# Patient Record
Sex: Male | Born: 1970 | Race: Black or African American | Hispanic: No | Marital: Single | Smoking: Never smoker
Health system: Southern US, Community
[De-identification: ages and names within clinical notes are randomized; demographics above are authoritative.]

## PROBLEM LIST (undated history)

## (undated) DIAGNOSIS — K644 Residual hemorrhoidal skin tags: Secondary | ICD-10-CM

## (undated) DIAGNOSIS — Z8619 Personal history of other infectious and parasitic diseases: Secondary | ICD-10-CM

## (undated) DIAGNOSIS — C61 Malignant neoplasm of prostate: Secondary | ICD-10-CM

## (undated) DIAGNOSIS — K921 Melena: Secondary | ICD-10-CM

## (undated) DIAGNOSIS — T7840XA Allergy, unspecified, initial encounter: Secondary | ICD-10-CM

## (undated) HISTORY — DX: Residual hemorrhoidal skin tags: K64.4

## (undated) HISTORY — DX: Personal history of other infectious and parasitic diseases: Z86.19

## (undated) HISTORY — DX: Allergy, unspecified, initial encounter: T78.40XA

## (undated) HISTORY — PX: COLONOSCOPY: SHX174

## (undated) HISTORY — DX: Melena: K92.1

## (undated) HISTORY — DX: Malignant neoplasm of prostate: C61

## (undated) HISTORY — PX: NO PAST SURGERIES: SHX2092

---

## 1998-01-24 ENCOUNTER — Emergency Department (HOSPITAL_COMMUNITY): Admission: EM | Admit: 1998-01-24 | Discharge: 1998-01-24 | Payer: Self-pay | Admitting: Emergency Medicine

## 1998-01-24 ENCOUNTER — Encounter: Payer: Self-pay | Admitting: Emergency Medicine

## 2001-01-16 ENCOUNTER — Emergency Department (HOSPITAL_COMMUNITY): Admission: EM | Admit: 2001-01-16 | Discharge: 2001-01-16 | Payer: Self-pay | Admitting: Emergency Medicine

## 2006-11-16 ENCOUNTER — Ambulatory Visit: Payer: Self-pay | Admitting: Family Medicine

## 2006-12-27 ENCOUNTER — Encounter: Admission: RE | Admit: 2006-12-27 | Discharge: 2006-12-27 | Payer: Self-pay | Admitting: Emergency Medicine

## 2007-10-13 IMAGING — US US ABDOMEN COMPLETE
1 series · 14 of 25 positions shown · non-contrast
Comparison: none

CLINICAL DATA: Epigastric abdominal pain. 
 ABDOMEN ULTRASOUND:
TECHNIQUE: Complete abdominal ultrasound examination was performed including evaluation of the liver, gallbladder, bile ducts, pancreas, kidneys, spleen, IVC, and abdominal aorta.

[Series 1: unknown · 0.28mm/px · 14 of 53 slices shown]
[im 1/53]
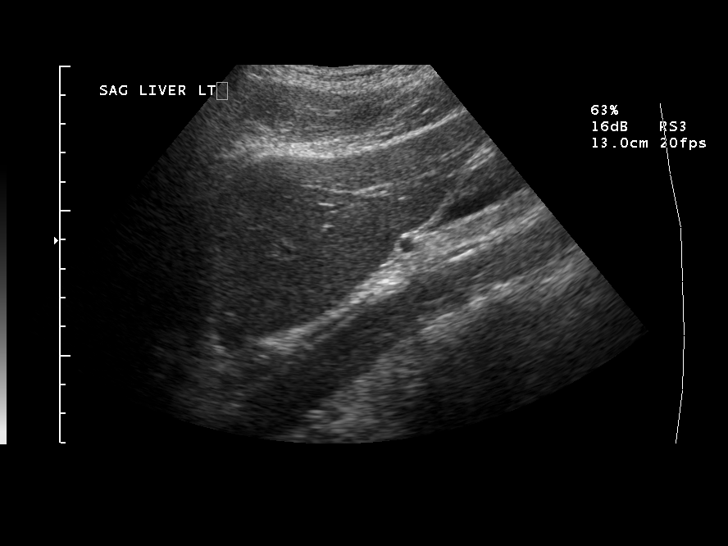
[im 5/53]
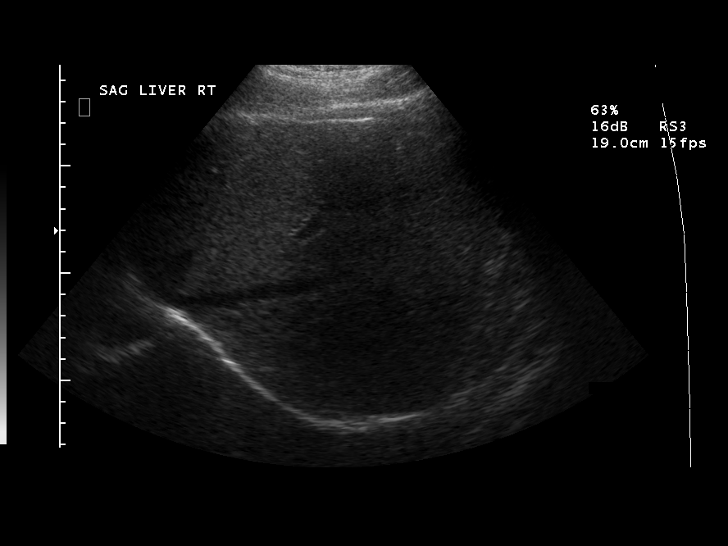
[im 9/53]
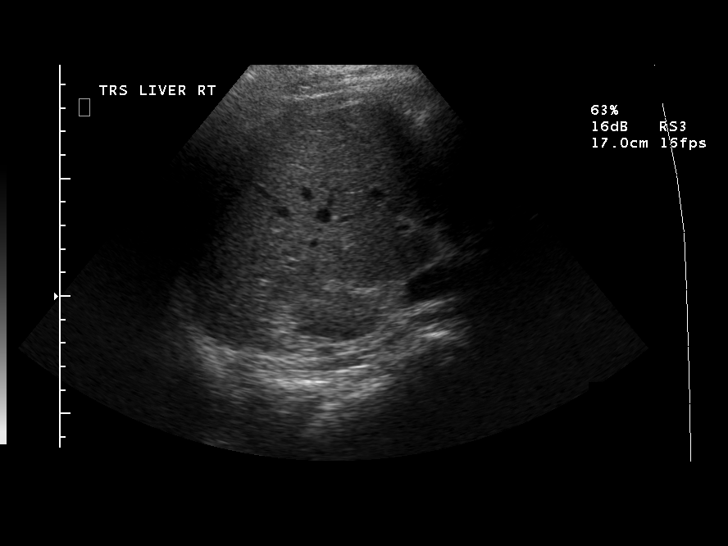
[im 14/53]
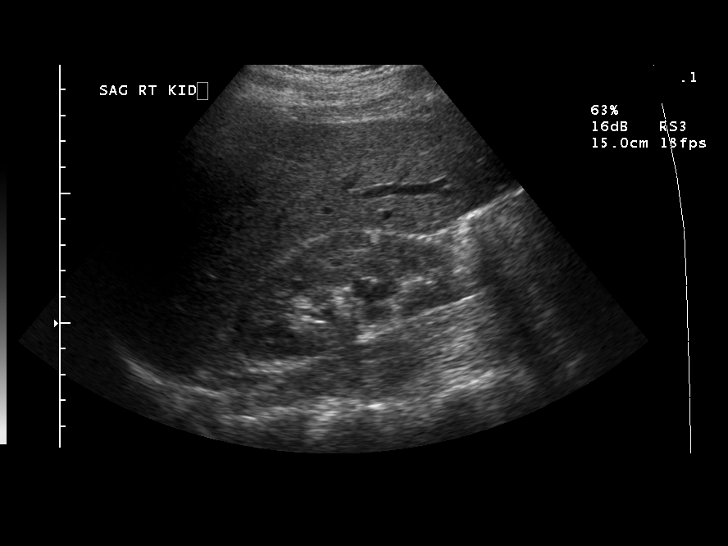
[im 18/53]
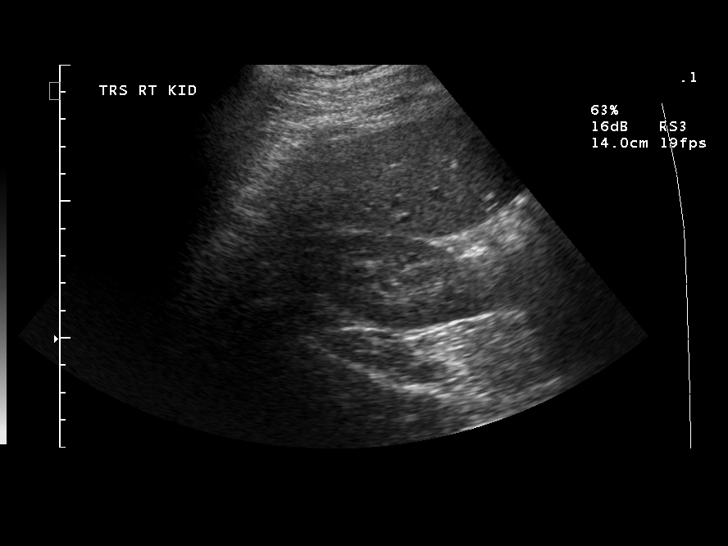
[im 20/53]
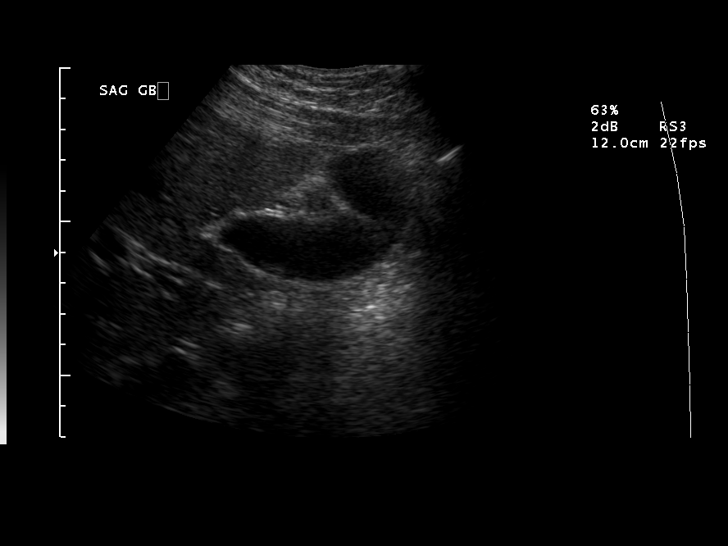
[im 24/53]
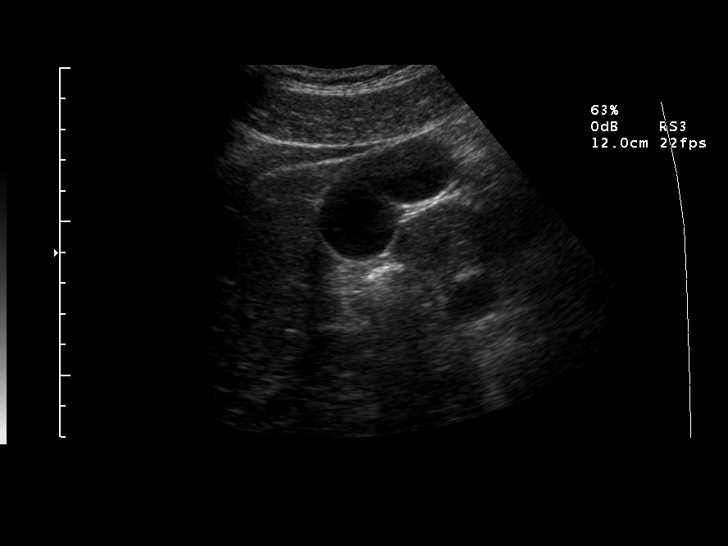
[im 29/53]
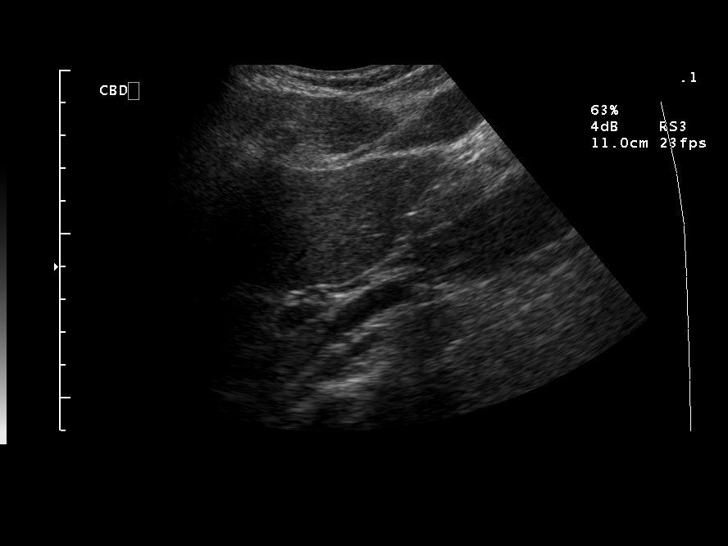
[im 33/53]
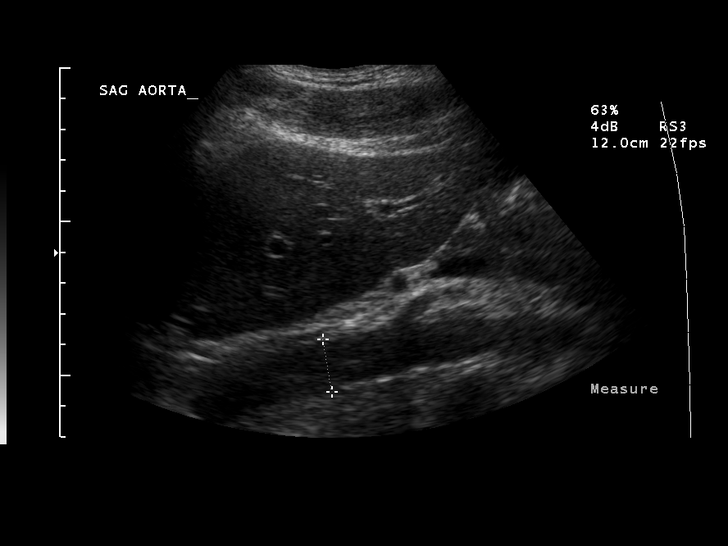
[im 35/53]
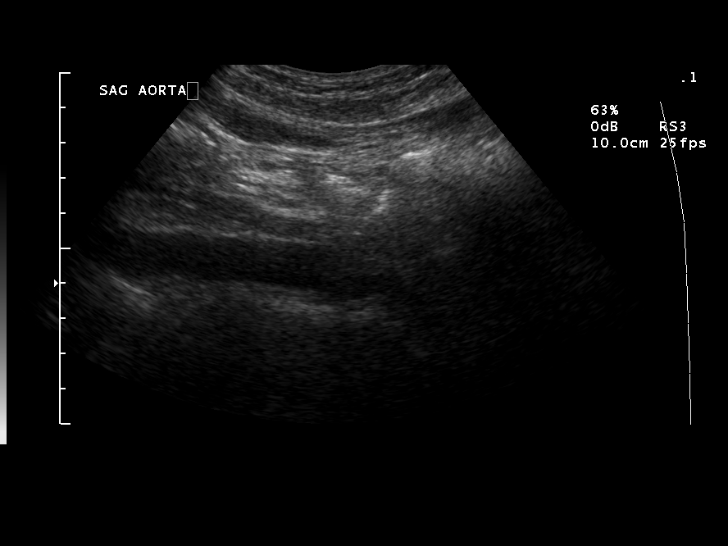
[im 40/53]
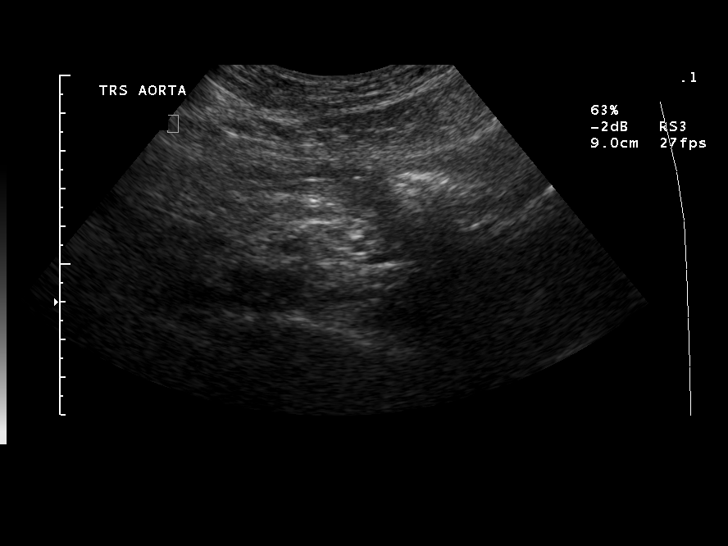
[im 44/53]
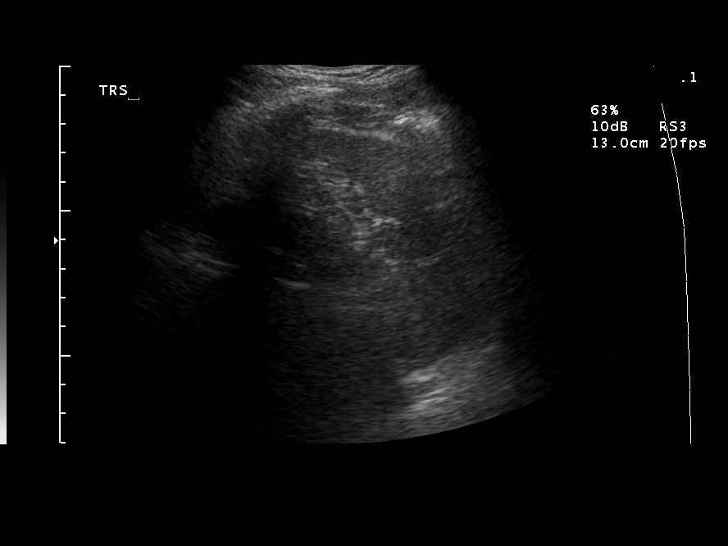
[im 48/53]
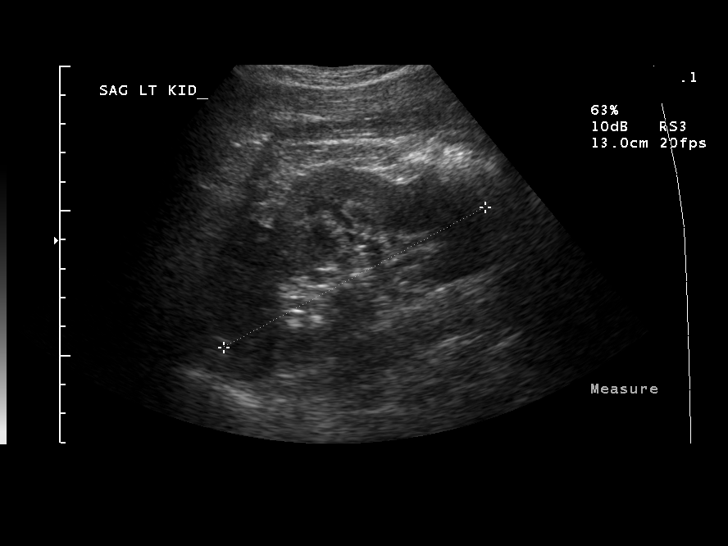
[im 53/53]
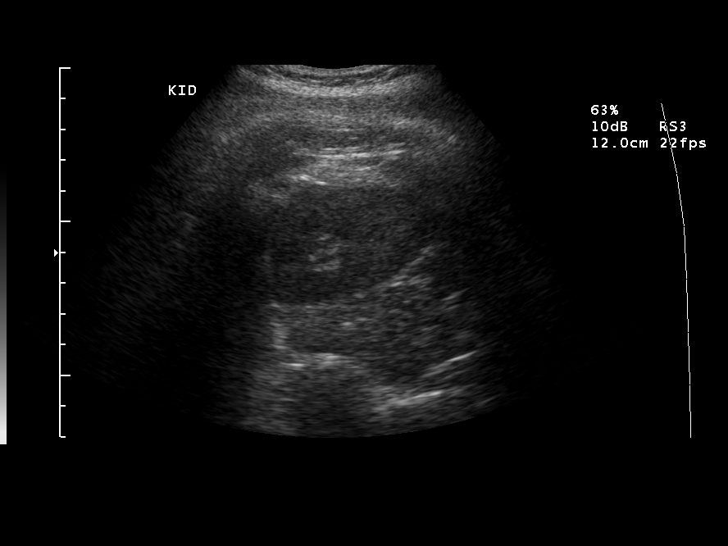

[14 of 25 positions shown; findings below may reference images not displayed]

FINDINGS: There is no evidence of gallstones or biliary ductal dilatation.  The liver is within normal limits in echogenicity, and no focal liver lesions are seen.  The visualized portions of the IVC and pancreas are unremarkable.
 There is no evidence of splenomegaly.  The kidneys are unremarkable, and there is no evidence of hydronephrosis.  The abdominal aorta is non-dilated.
IMPRESSION: Negative abdominal ultrasound.

## 2011-04-12 ENCOUNTER — Ambulatory Visit: Payer: Self-pay | Admitting: Family Medicine

## 2011-04-14 ENCOUNTER — Ambulatory Visit (INDEPENDENT_AMBULATORY_CARE_PROVIDER_SITE_OTHER): Payer: BC Managed Care – PPO | Admitting: Family Medicine

## 2011-04-14 ENCOUNTER — Encounter: Payer: Self-pay | Admitting: Family Medicine

## 2011-04-14 DIAGNOSIS — Z13220 Encounter for screening for lipoid disorders: Secondary | ICD-10-CM

## 2011-04-14 DIAGNOSIS — Z833 Family history of diabetes mellitus: Secondary | ICD-10-CM

## 2011-04-14 DIAGNOSIS — Z125 Encounter for screening for malignant neoplasm of prostate: Secondary | ICD-10-CM

## 2011-04-14 DIAGNOSIS — Z23 Encounter for immunization: Secondary | ICD-10-CM

## 2011-04-14 DIAGNOSIS — K649 Unspecified hemorrhoids: Secondary | ICD-10-CM

## 2011-04-14 NOTE — Patient Instructions (Addendum)
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  On the way out- ask about getting a lab visit set up (along with a flu shot then, too). You can get your results through our phone system.  Follow the instructions on the blue card. Let me know if you have other concerns.  If you have more bleeding, let me know

## 2011-04-15 DIAGNOSIS — Z125 Encounter for screening for malignant neoplasm of prostate: Secondary | ICD-10-CM | POA: Insufficient documentation

## 2011-04-15 DIAGNOSIS — Z23 Encounter for immunization: Secondary | ICD-10-CM | POA: Insufficient documentation

## 2011-04-15 DIAGNOSIS — Z833 Family history of diabetes mellitus: Secondary | ICD-10-CM | POA: Insufficient documentation

## 2011-04-15 DIAGNOSIS — K649 Unspecified hemorrhoids: Secondary | ICD-10-CM | POA: Insufficient documentation

## 2011-04-15 NOTE — Assessment & Plan Note (Signed)
Return for fasting labs.  Continue exercise .

## 2011-04-15 NOTE — Assessment & Plan Note (Signed)
Done today.

## 2011-04-15 NOTE — Progress Notes (Signed)
Ext hemorrhoid.  Blood in stool with painful BM. BRBPR.  Episodic.  No known FH colon CA.  No other bleeding, bruising.  Stools softer with metamucil use.  No abd pain.  No vomiting or other GI sx.    Due for Tdap.  FH DM, due for labs.  No known personal h/o DM2.    Prostate cancer screening.  No known FH, but little known about father's history.  No dysuria.  We talked about options.    PMH and SH reviewed  ROS: See HPI, otherwise noncontributory.  Meds, vitals, and allergies reviewed.   GEN: nad, alert and oriented HEENT: mucous membranes moist NECK: supple w/o LA CV: rrr.  no murmur PULM: ctab, no inc wob ABD: soft, +bs EXT: no edema SKIN: no acute rash Prostate gland firm and smooth, no enlargement, nodularity, tenderness, mass, asymmetry or induration. Ext hemorrhoids noted, old and nonthrombosed

## 2011-04-15 NOTE — Assessment & Plan Note (Signed)
Will get this when he returns for labs .

## 2011-04-15 NOTE — Assessment & Plan Note (Signed)
Anatomy d/w pt.  Likely cause of bleeding.  If he continues to have bleeding in spite of soft stools (recently started metamucil) then we can refer.  No ominous findings and no FH colon CA.

## 2011-04-15 NOTE — Assessment & Plan Note (Signed)
Normal DRE and will check PSA for baseline given the lack of available FH.  He agrees.

## 2011-04-19 ENCOUNTER — Other Ambulatory Visit (INDEPENDENT_AMBULATORY_CARE_PROVIDER_SITE_OTHER): Payer: BC Managed Care – PPO

## 2011-04-19 DIAGNOSIS — Z833 Family history of diabetes mellitus: Secondary | ICD-10-CM

## 2011-04-19 DIAGNOSIS — Z125 Encounter for screening for malignant neoplasm of prostate: Secondary | ICD-10-CM

## 2011-04-19 DIAGNOSIS — Z13220 Encounter for screening for lipoid disorders: Secondary | ICD-10-CM

## 2011-04-19 LAB — LIPID PANEL
Cholesterol: 211 mg/dL — ABNORMAL HIGH (ref 0–200)
Total CHOL/HDL Ratio: 5
VLDL: 32.6 mg/dL (ref 0.0–40.0)

## 2011-04-19 LAB — PSA: PSA: 1.28 ng/mL (ref 0.10–4.00)

## 2011-10-04 ENCOUNTER — Ambulatory Visit (INDEPENDENT_AMBULATORY_CARE_PROVIDER_SITE_OTHER): Payer: BC Managed Care – PPO | Admitting: Family Medicine

## 2011-10-04 ENCOUNTER — Encounter: Payer: Self-pay | Admitting: Family Medicine

## 2011-10-04 VITALS — BP 124/70 | HR 78 | Temp 98.5°F | Wt 227.0 lb

## 2011-10-04 DIAGNOSIS — M25519 Pain in unspecified shoulder: Secondary | ICD-10-CM

## 2011-10-04 MED ORDER — TRAMADOL HCL 50 MG PO TABS: 50.0000 mg | ORAL_TABLET | Freq: Three times a day (TID) | ORAL | Status: DC | PRN

## 2011-10-04 MED ORDER — IBUPROFEN 200 MG PO TABS: 600.0000 mg | ORAL_TABLET | Freq: Three times a day (TID) | ORAL | Status: AC | PRN

## 2011-10-04 NOTE — Assessment & Plan Note (Signed)
Likely cuff defect and needs ortho eval.  He is some better today in terms of pain.  D/w pt about nsaids with GI caution, sedation caution on tramadol and ROM exercise to prev frozen shoulder.  Anatomy d/w pt.  He understood.  App ortho help.

## 2011-10-04 NOTE — Progress Notes (Signed)
Saturday his L shoulder started getting stiff.  More painful Sunday.  Use a stick-on icy hot pad with some relief.  Had been exercising regularly.  Was fine lifting at the end of last week before the pain started.  Was lifting his baseline level of weight.  Was benching 245.  Shoulder shrugs 100lbs on each hand.  Curling 115 on a bar (57.5 per arm).  R shoulder is fine. Feeling well o/w.    Can't wash his hair or reach for something in his back pocket with his L hand.    Meds, vitals, and allergies reviewed.   ROS: See HPI.  Otherwise, noncontributory.  nad Normal ROM at the neck R shoulder with normal ROM L shoulder with normal inspection Pain with ext rotation>int rotation.  + scap assist, improves ext rotation. No arm drop but sig pain with abduction >90 deg Speed's neg, + impingement.  Distally nv intact

## 2011-10-04 NOTE — Patient Instructions (Signed)
Take ibuprofen and tramadol for pain.  No lifting in the meantime.  Use the range of motion exercises we talked about.  See Shirlee Limerick about your referral before you leave today.

## 2012-02-01 ENCOUNTER — Encounter: Payer: Self-pay | Admitting: Family Medicine

## 2012-02-01 ENCOUNTER — Ambulatory Visit (INDEPENDENT_AMBULATORY_CARE_PROVIDER_SITE_OTHER): Payer: BC Managed Care – PPO | Admitting: Family Medicine

## 2012-02-01 VITALS — BP 158/102 | HR 68 | Temp 98.5°F | Wt 222.5 lb

## 2012-02-01 DIAGNOSIS — M549 Dorsalgia, unspecified: Secondary | ICD-10-CM | POA: Insufficient documentation

## 2012-02-01 MED ORDER — CYCLOBENZAPRINE HCL 10 MG PO TABS: 5.0000 mg | ORAL_TABLET | Freq: Three times a day (TID) | ORAL | Status: DC | PRN

## 2012-02-01 MED ORDER — IBUPROFEN 600 MG PO TABS: 600.0000 mg | ORAL_TABLET | Freq: Three times a day (TID) | ORAL | Status: DC | PRN

## 2012-02-01 NOTE — Patient Instructions (Signed)
Use the stretches for your back.  Take 600mg  of ibuprofen with food up to 3 times a day.  Take 5-10mg  of flexeril up to 3 times a day (it can make you drowsy).  This should get better.  Take care.   Check your BP when you aren't hurting and notify me if consistently >140/>90.

## 2012-02-01 NOTE — Progress Notes (Signed)
Back pain.  Going on for a few weeks.  Pain in R>L lower side.  It 'catches' when he tries to get up from sitting.  Gets tight is not mobile.  Some better standing.  No trauma, no trigger known.  Has been exercising, at his baseline. No FCNAVD.  No weakness in legs.  Occ soreness in thighs.  He can get relief laying down at night, but can have pain rolling over.    Meds, vitals, and allergies reviewed.   ROS: See HPI.  Otherwise, noncontributory.  nad rrr ctab Back w/o midline pain No rash, back isn't ttp in paraspinal muscles superficially but he has pain on R SI testing and facet loading SLR neg Distally nv intact with normal strength, sensation, DTRs in BLE.

## 2012-02-01 NOTE — Assessment & Plan Note (Signed)
Likely facet and SI irritation with deep muscle spasm.  No need to image today.  Would use stretching, handout given.  GI caution on ibuprofen and sedation caution on flexeril. . Should improve.  F/u prn.  Anatomy d/w pt.  He agrees.

## 2012-02-10 ENCOUNTER — Telehealth: Payer: Self-pay

## 2012-02-10 NOTE — Telephone Encounter (Signed)
Pt left v/m BP was 150/? Could not understand diastolic reading. Called numerous times. Home # memory is full. Work # not accepting calls and cell # not working #. Will try again.

## 2012-02-14 NOTE — Telephone Encounter (Signed)
Tried again to contact pt by phone; same results as 02/10/12. Mailed letter to pt requesting call back with BP reading.

## 2012-06-28 ENCOUNTER — Ambulatory Visit (INDEPENDENT_AMBULATORY_CARE_PROVIDER_SITE_OTHER): Payer: BC Managed Care – PPO | Admitting: Family Medicine

## 2012-06-28 ENCOUNTER — Encounter: Payer: Self-pay | Admitting: Family Medicine

## 2012-06-28 VITALS — BP 128/86 | HR 72 | Temp 98.1°F | Wt 217.8 lb

## 2012-06-28 DIAGNOSIS — R030 Elevated blood-pressure reading, without diagnosis of hypertension: Secondary | ICD-10-CM

## 2012-06-28 DIAGNOSIS — N529 Male erectile dysfunction, unspecified: Secondary | ICD-10-CM

## 2012-06-28 MED ORDER — SILDENAFIL CITRATE 100 MG PO TABS: 50.0000 mg | ORAL_TABLET | Freq: Every day | ORAL | Status: DC | PRN

## 2012-06-28 NOTE — Assessment & Plan Note (Signed)
Likely with a stress component.  Would use viagra for now and I would expect this to resolve.

## 2012-06-28 NOTE — Assessment & Plan Note (Signed)
Normalized today.  Likely related to diet/exercise changes and social upheaval.  Reassured.  No intervention other than diet/exercise.

## 2012-06-28 NOTE — Progress Notes (Signed)
His shoulder is better. Saw ortho.  Didn't need injection.   Here to f/u on BP readings.  His BP was up (~140/88) on a check for pre employment eval. He was advised to have it rechecked.   He isn't taking NSAIDs.    Holidays (after the death of his mother) were tough for him. He is doing better recently.  He's been more active recently.  No SI/HI.  He's starting back to exercising.  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   He noted ED during prev periods of high stress, years ago.  He improved on viagra and did well for years.  ED returned during recent period of high stress described above.   Meds, vitals, and allergies reviewed.   ROS: See HPI.  Otherwise, noncontributory.  GEN: nad, alert and oriented HEENT: mucous membranes moist NECK: supple w/o LA CV: rrr.  no murmur PULM: ctab, no inc wob ABD: soft, +bs EXT: no edema SKIN: no acute rash  Recheck BP 122/82

## 2012-06-28 NOTE — Patient Instructions (Addendum)
If you aren't improving with the medicine, then let me know. Your BP was fine.  Take care.

## 2012-07-11 ENCOUNTER — Other Ambulatory Visit: Payer: Self-pay | Admitting: Family Medicine

## 2012-07-16 ENCOUNTER — Other Ambulatory Visit: Payer: BC Managed Care – PPO

## 2012-07-23 ENCOUNTER — Encounter: Payer: BC Managed Care – PPO | Admitting: Family Medicine

## 2012-09-11 ENCOUNTER — Other Ambulatory Visit (INDEPENDENT_AMBULATORY_CARE_PROVIDER_SITE_OTHER): Payer: BC Managed Care – PPO

## 2012-09-11 DIAGNOSIS — E7800 Pure hypercholesterolemia, unspecified: Secondary | ICD-10-CM

## 2012-09-11 LAB — BASIC METABOLIC PANEL
BUN: 16 mg/dL (ref 6–23)
CO2: 28 mEq/L (ref 19–32)
Chloride: 103 mEq/L (ref 96–112)
Creatinine, Ser: 1.4 mg/dL (ref 0.4–1.5)
Glucose, Bld: 101 mg/dL — ABNORMAL HIGH (ref 70–99)

## 2012-09-11 LAB — LIPID PANEL
LDL Cholesterol: 116 mg/dL — ABNORMAL HIGH (ref 0–99)
Total CHOL/HDL Ratio: 4

## 2012-09-18 ENCOUNTER — Encounter: Payer: Self-pay | Admitting: Family Medicine

## 2012-09-18 ENCOUNTER — Ambulatory Visit (INDEPENDENT_AMBULATORY_CARE_PROVIDER_SITE_OTHER): Payer: BC Managed Care – PPO | Admitting: Family Medicine

## 2012-09-18 VITALS — BP 130/70 | HR 64 | Temp 98.0°F | Ht 74.0 in | Wt 219.8 lb

## 2012-09-18 DIAGNOSIS — Z113 Encounter for screening for infections with a predominantly sexual mode of transmission: Secondary | ICD-10-CM

## 2012-09-18 DIAGNOSIS — Z0000 Encounter for general adult medical examination without abnormal findings: Secondary | ICD-10-CM

## 2012-09-18 DIAGNOSIS — M722 Plantar fascial fibromatosis: Secondary | ICD-10-CM

## 2012-09-18 MED ORDER — TRAMADOL HCL 50 MG PO TABS: 50.0000 mg | ORAL_TABLET | Freq: Three times a day (TID) | ORAL | Status: AC | PRN

## 2012-09-18 NOTE — Progress Notes (Signed)
CPE- See plan.  Routine anticipatory guidance given to patient.  See health maintenance. Tetanus 2012 Flu shot encouraged.   PSA prev wnl.   Colon cancer screening not due.  Diet and exercise discussed.  Living will.  Andrew Mcconnell (phone [phone]) would be designated if incapacitated.   He wanted STD testing.  Discussed. No sx.   He had taken tramadol occ for his back with relief. Rare use.  He's working out more but with less weight.  He's running more.  He's stretching a lot.  He's had some intermittent L heel pain.  He also has some L calf soreness running; the heel pain predates the calf.  He running on a track and a treadmill.   PMH and SH reviewed  Meds, vitals, and allergies reviewed.   ROS: See HPI.  Otherwise negative.    GEN: nad, alert and oriented HEENT: mucous membranes moist NECK: supple w/o LA CV: rrr. PULM: ctab, no inc wob ABD: soft, +bs EXT: no edema SKIN: no acute rash L calf not ttp Normal L DP pulse Pain at L plantar fascia origin.

## 2012-09-18 NOTE — Patient Instructions (Addendum)
Go to the lab on the way out.  We'll contact you with your lab report.  Stretch your arch each morning (plantar fasciitis).  Then gently stretch your calf daily.   I would ease off running for now and then gradually start back.  This should improve.  Take care.  Glad to see you.

## 2012-09-19 DIAGNOSIS — M722 Plantar fascial fibromatosis: Secondary | ICD-10-CM | POA: Insufficient documentation

## 2012-09-19 DIAGNOSIS — Z0000 Encounter for general adult medical examination without abnormal findings: Secondary | ICD-10-CM | POA: Insufficient documentation

## 2012-09-19 LAB — GC/CHLAMYDIA PROBE AMP: CT Probe RNA: NEGATIVE

## 2012-09-19 LAB — RPR

## 2012-09-19 NOTE — Assessment & Plan Note (Signed)
Routine anticipatory guidance given to patient.  See health maintenance. Tetanus 2012 Flu shot encouraged.   PSA prev wnl.   Colon cancer screening not due.  Diet and exercise discussed.  Living will.  Andrew Mcconnell (phone [phone]) would be designated if incapacitated.   He wanted STD testing.  Discussed. No sx.

## 2012-09-19 NOTE — Assessment & Plan Note (Signed)
Typical exam.  Would stretch before getting up and this should resolve.  Likely the gait changes from this affected his calf and caused a strain there.  Fu prn. D/w pt about good arch support in shoes.

## 2012-10-26 ENCOUNTER — Encounter: Payer: Self-pay | Admitting: Family Medicine

## 2012-10-26 ENCOUNTER — Ambulatory Visit (INDEPENDENT_AMBULATORY_CARE_PROVIDER_SITE_OTHER): Payer: BC Managed Care – PPO | Admitting: Family Medicine

## 2012-10-26 VITALS — BP 124/82 | HR 81 | Temp 98.2°F | Wt 210.2 lb

## 2012-10-26 DIAGNOSIS — J029 Acute pharyngitis, unspecified: Secondary | ICD-10-CM

## 2012-10-26 MED ORDER — CEFTRIAXONE SODIUM 1 G IJ SOLR
1.0000 g | Freq: Once | INTRAMUSCULAR | Status: AC
  Administered 2012-10-26: 1 g via INTRAMUSCULAR

## 2012-10-26 MED ORDER — CLINDAMYCIN HCL 300 MG PO CAPS: 300.0000 mg | ORAL_CAPSULE | Freq: Three times a day (TID) | ORAL | Status: DC

## 2012-10-26 NOTE — Assessment & Plan Note (Signed)
Unilateral... No deviation of uvula but significant swelling on left... Concerning for possible abscess.  Strep test negative but it is possible poor swab by RN given pt hesitant to open mouth wide.  Will treat with IM ceftriaxone now and start clindamycin for aerobe and anerobe cover tonight.  Ibuprofen for pain.  If not improving her will need to be seen in Saturday clinic for re-assessment.  If worsening.. Pt told to go to ER for likely ENT intervention, possible abcess drainage.

## 2012-10-26 NOTE — Progress Notes (Signed)
Subjective:    Patient ID: Andrew Mcconnell, male    DOB: 16-Nov-1970, 42 y.o.   MRN: [id number]  Sore Throat  This is a new problem. The current episode started in the past 7 days (5-6 days ago). The problem has been gradually worsening. The pain is worse on the left side. Maximum temperature: subjective sweating fever off and on. The pain is at a severity of 8/10. The pain is severe. Associated symptoms include ear pain, a hoarse voice, swollen glands and trouble swallowing. Pertinent negatives include no abdominal pain, congestion, coughing, drooling, ear discharge, headaches, plugged ear sensation, neck pain or shortness of breath. Associated symptoms comments: Left ear, left side of face . He has had no exposure to strep or mono. He has tried NSAIDs for the symptoms.      Review of Systems  Constitutional: Positive for fever. Negative for fatigue.  HENT: Positive for ear pain, hoarse voice and trouble swallowing. Negative for congestion, drooling, neck pain and ear discharge.   Eyes: Negative for pain.  Respiratory: Negative for cough and shortness of breath.   Cardiovascular: Negative for chest pain and leg swelling.  Gastrointestinal: Negative for abdominal pain.  Genitourinary: Negative for dysuria.  Neurological: Negative for headaches.       Objective:   Physical Exam  Constitutional: Vital signs are normal. He appears well-developed and well-nourished.  Non-toxic appearance. He does not appear ill. No distress.  HENT:  Head: Normocephalic and atraumatic.  Right Ear: Hearing, tympanic membrane, external ear and ear canal normal. No tenderness. No foreign bodies. Tympanic membrane is not retracted and not bulging.  Left Ear: Hearing, tympanic membrane, external ear and ear canal normal. No tenderness. No foreign bodies. Tympanic membrane is not retracted and not bulging.  Nose: Nose normal. No mucosal edema or rhinorrhea. Right sinus exhibits no maxillary sinus tenderness and no  frontal sinus tenderness. Left sinus exhibits no maxillary sinus tenderness and no frontal sinus tenderness.  Mouth/Throat: Uvula is midline and mucous membranes are normal. Normal dentition. No edematous or dental caries. Oropharyngeal exudate, posterior oropharyngeal edema and posterior oropharyngeal erythema present.  Left sided swelling of tonsil, erythema, almost to midline but does not reach midline ( no uvula deviation), some food in crypts versus pus noted. No swelling of right tonsil.  Eyes: Conjunctivae, EOM and lids are normal. Pupils are equal, round, and reactive to light. No foreign bodies found.  Neck: Trachea normal, normal range of motion and phonation normal. Neck supple. Carotid bruit is not present. No mass and no thyromegaly present.  Cardiovascular: Normal rate, regular rhythm, S1 normal, S2 normal, normal heart sounds, intact distal pulses and normal pulses.  Exam reveals no gallop.   No murmur heard. Pulmonary/Chest: Effort normal and breath sounds normal. No respiratory distress. He has no wheezes. He has no rhonchi. He has no rales.  Abdominal: Soft. Normal appearance and bowel sounds are normal. There is no hepatosplenomegaly. There is no tenderness. There is no rebound, no guarding and no CVA tenderness. No hernia.  Neurological: He is alert. He has normal reflexes.  Skin: Skin is warm, dry and intact. No rash noted.  Psychiatric: He has a normal mood and affect. His speech is normal and behavior is normal. Judgment normal.          Assessment & Plan:

## 2012-10-26 NOTE — Patient Instructions (Addendum)
Close follow up with Dr Para March on Monday. Start clindamycin 300 mg every three hours ...start tonight. Ibuprofen 800 mg three times a day for pain. Injection of ceftriaxone antibiotic given in office today. If you are not having much improvement in next 24 hours call for appt in Saturday clinic. If difficulty swallowing worsening significantly, or any shortness of breath, drooling... Go to ER ASAP.

## 2012-10-26 NOTE — Addendum Note (Signed)
Addended by: Shon Millet on: 10/26/2012 04:51 PM   Modules accepted: Orders

## 2012-10-29 ENCOUNTER — Ambulatory Visit: Payer: BC Managed Care – PPO | Admitting: Family Medicine

## 2012-10-30 ENCOUNTER — Ambulatory Visit: Payer: BC Managed Care – PPO | Admitting: Family Medicine

## 2012-10-30 DIAGNOSIS — Z0289 Encounter for other administrative examinations: Secondary | ICD-10-CM

## 2014-04-15 ENCOUNTER — Encounter: Payer: Self-pay | Admitting: Family Medicine

## 2014-04-15 ENCOUNTER — Ambulatory Visit (INDEPENDENT_AMBULATORY_CARE_PROVIDER_SITE_OTHER): Payer: Managed Care, Other (non HMO) | Admitting: Family Medicine

## 2014-04-15 VITALS — BP 142/84 | HR 75 | Temp 98.5°F | Wt 224.2 lb

## 2014-04-15 DIAGNOSIS — R7989 Other specified abnormal findings of blood chemistry: Secondary | ICD-10-CM | POA: Diagnosis not present

## 2014-04-15 DIAGNOSIS — N529 Male erectile dysfunction, unspecified: Secondary | ICD-10-CM

## 2014-04-15 LAB — COMPREHENSIVE METABOLIC PANEL
ALK PHOS: 79 U/L (ref 39–117)
ALT: 54 U/L — AB (ref 0–53)
AST: 38 U/L — AB (ref 0–37)
Albumin: 4.3 g/dL (ref 3.5–5.2)
BUN: 13 mg/dL (ref 6–23)
CO2: 26 meq/L (ref 19–32)
CREATININE: 1.2 mg/dL (ref 0.4–1.5)
Calcium: 9.5 mg/dL (ref 8.4–10.5)
Chloride: 101 mEq/L (ref 96–112)
GFR: 81.55 mL/min (ref >60.00)
Glucose, Bld: 97 mg/dL (ref 70–99)
Potassium: 4.1 mEq/L (ref 3.5–5.1)
SODIUM: 136 meq/L (ref 135–145)
TOTAL PROTEIN: 8.3 g/dL (ref 6.0–8.3)
Total Bilirubin: 1.5 mg/dL — ABNORMAL HIGH (ref 0.2–1.2)

## 2014-04-15 LAB — CBC WITH DIFFERENTIAL/PLATELET
BASOS ABS: 0 10*3/uL (ref 0.0–0.1)
Basophils Relative: 0.5 % (ref 0.0–3.0)
Eosinophils Absolute: 0 10*3/uL (ref 0.0–0.7)
Eosinophils Relative: 0.6 % (ref 0.0–5.0)
HCT: 51.5 % (ref 39.0–52.0)
Hemoglobin: 17 g/dL (ref 13.0–17.0)
LYMPHS ABS: 1.8 10*3/uL (ref 0.7–4.0)
Lymphocytes Relative: 34.3 % (ref 12.0–46.0)
MCHC: 33 g/dL (ref 30.0–36.0)
MCV: 99.6 fl (ref 78.0–100.0)
MONO ABS: 0.5 10*3/uL (ref 0.1–1.0)
Monocytes Relative: 9.2 % (ref 3.0–12.0)
Neutro Abs: 2.9 10*3/uL (ref 1.4–7.7)
Neutrophils Relative %: 55.4 % (ref 43.0–77.0)
PLATELETS: 257 10*3/uL (ref 150.0–400.0)
RBC: 5.17 Mil/uL (ref 4.22–5.81)
RDW: 13.9 % (ref 11.5–15.5)
WBC: 5.2 10*3/uL (ref 4.0–10.5)

## 2014-04-15 LAB — URINALYSIS, ROUTINE W REFLEX MICROSCOPIC
Bilirubin Urine: NEGATIVE
Hgb urine dipstick: NEGATIVE
Ketones, ur: NEGATIVE
LEUKOCYTES UA: NEGATIVE
Nitrite: NEGATIVE
SPECIFIC GRAVITY, URINE: 1.015 (ref 1.000–1.030)
Total Protein, Urine: NEGATIVE
URINE GLUCOSE: NEGATIVE
Urobilinogen, UA: 0.2 (ref 0.0–1.0)
pH: 6.5 (ref 5.0–8.0)

## 2014-04-15 LAB — VITAMIN B12: Vitamin B-12: 578 pg/mL (ref 211–911)

## 2014-04-15 LAB — IBC PANEL
IRON: 100 ug/dL (ref 42–165)
Saturation Ratios: 25.4 % (ref 20.0–50.0)
Transferrin: 281.1 mg/dL (ref 212.0–360.0)

## 2014-04-15 LAB — FOLATE: Folate: 11.5 ng/mL (ref >5.9)

## 2014-04-15 MED ORDER — SILDENAFIL CITRATE 100 MG PO TABS: 50.0000 mg | ORAL_TABLET | Freq: Every day | ORAL | Status: DC | PRN

## 2014-04-15 NOTE — Progress Notes (Signed)
Pre visit review using our clinic review tool, if applicable. No additional management support is needed unless otherwise documented below in the visit note.  ED.  Viagra helped.  Needs a refill.  No ADE on meds.   Abnormal CBC, HGB 17.2.  Done as part of lab panel at work.  See scanned forms.  Also told he has some protein in his urine.  He is a muscular, tall male, ie high muscle mass.  No supplements.  No testosterone use, not a smoker.  No h/o blood disorders.  Cr was similar to prev, 1.4, likely related to muscle mass.  He feels well.  No complaints o/w.    Meds, vitals, and allergies reviewed.   ROS: See HPI.  Otherwise, noncontributory.  GEN: nad, alert and oriented HEENT: mucous membranes moist NECK: supple w/o LA CV: rrr.  no murmur PULM: ctab, no inc wob ABD: soft, +bs EXT: no edema SKIN: no acute rash

## 2014-04-15 NOTE — Patient Instructions (Signed)
Go to the lab on the way out.  We'll contact you with your lab report. Take care.  Glad to see you.  

## 2014-04-16 DIAGNOSIS — R7989 Other specified abnormal findings of blood chemistry: Secondary | ICD-10-CM | POA: Insufficient documentation

## 2014-04-16 NOTE — Assessment & Plan Note (Signed)
See scanned forms.  Reassuring labs on recheck.  He feels well.  No complaints o/w.   No follow up needed.  See notes on labs.

## 2014-04-16 NOTE — Assessment & Plan Note (Signed)
Continue prn viagra.  No ADE on med.

## 2014-04-17 ENCOUNTER — Telehealth: Payer: Self-pay

## 2014-04-17 NOTE — Telephone Encounter (Signed)
PLEASE NOTE: All timestamps contained within this report are represented as [place] Standard Time. CONFIDENTIALTY NOTICE: This fax transmission is intended only for the addressee. It contains information that is legally privileged, confidential or otherwise protected from use or disclosure. If you are not the intended recipient, you are strictly prohibited from reviewing, disclosing, copying using or disseminating any of this information or taking any action in reliance on or regarding this information. If you have received this fax in error, please notify us immediately by telephone so that we can arrange for its return to us. Phone: [phone], Toll-Free: [phone], Fax: [phone] Page: 1 of 1 Call Id: [id number] [facility] Client TELEPHONE ADVICE RECORD [facility] Patient Name: Andrew Mcconnell Gender: Male DOB: [birth year] Age: [age] Y 8 M 26 D Return Phone Number: [phone] (Primary) Address: [address] City[place]State/Zip: [place] [address] Client [facility] Night Physician Raechel Acheuncan, Shaw Contact Type Call Caller Name Same Caller Phone Number Same Relationship To Patient Self Is this call to report lab results? No Call Type General Information Initial Comment Caller states he just received a call back and was instructed to call back. General Information Type Message Only Nurse Assessment Guidelines Guideline Title Affirmed Question Affirmed Notes Nurse Date/Time (Eastern Time) Disp. Time [name](Eastern Time) Disposition Final User 04/16/2014 7:18:14 PM General Information Provided Yes Enid DerryBrewer, [place] [facility] Instructions Given Call Event Type User Date / Time Description

## 2014-04-17 NOTE — Telephone Encounter (Signed)
Patient advised.

## 2014-10-30 ENCOUNTER — Telehealth: Payer: Self-pay | Admitting: Family Medicine

## 2014-10-30 NOTE — Telephone Encounter (Signed)
Pt called - wants to change rx from viagra to cialis due to cost.  cb number [phone], thanks

## 2014-10-31 MED ORDER — TADALAFIL 20 MG PO TABS: 10.0000 mg | ORAL_TABLET | ORAL | Status: DC | PRN

## 2014-10-31 NOTE — Telephone Encounter (Signed)
Sent. Thanks.   

## 2014-10-31 NOTE — Telephone Encounter (Signed)
Patient advised.

## 2015-04-01 ENCOUNTER — Encounter: Payer: Self-pay | Admitting: Family Medicine

## 2015-04-01 ENCOUNTER — Ambulatory Visit (INDEPENDENT_AMBULATORY_CARE_PROVIDER_SITE_OTHER): Payer: Managed Care, Other (non HMO) | Admitting: Family Medicine

## 2015-04-01 VITALS — BP 130/84 | HR 86 | Temp 98.7°F | Wt 227.5 lb

## 2015-04-01 DIAGNOSIS — R0683 Snoring: Secondary | ICD-10-CM

## 2015-04-01 DIAGNOSIS — M25512 Pain in left shoulder: Secondary | ICD-10-CM | POA: Diagnosis not present

## 2015-04-01 MED ORDER — IBUPROFEN 600 MG PO TABS: 600.0000 mg | ORAL_TABLET | Freq: Three times a day (TID) | ORAL | Status: DC | PRN

## 2015-04-01 NOTE — Assessment & Plan Note (Signed)
Concern for OSA, path phys d/w pt.  Refer.  He agrees.

## 2015-04-01 NOTE — Assessment & Plan Note (Signed)
Likely cuff strain.  + scap assist.  D/w pt.   Likely with relative pec>back strength and scap dysfunction.  Restart cuff exercise.  Handout given Will ask for sports med eval if not better.  Restart ibuprofen.  He agrees.

## 2015-04-01 NOTE — Patient Instructions (Signed)
Revonda Standardllison will call about your referral.  See her on the way out.  Ibuprofen with food, shoulder exercises for now.   If not better, then ask about seeing Dr. Patsy Lageropland.  Take care.  Glad to see you.

## 2015-04-01 NOTE — Progress Notes (Signed)
Pre visit review using our clinic review tool, if applicable. No additional management support is needed unless otherwise documented below in the visit note.  H/o L shoulder pain years ago, saw ortho years ago.  Was doing well until recently.   2 months ago started having stiffness in the L shoulder, just like prev, ie years ago.  Sore after some lifting at work.  No pop or snap, no injury o/w.   Now with pain at night, has to work to move his shoulder to keep it from hurting more.  Pain laying on L side by the end of the night.   He hasn't been lifting in the last 2 months.   Some better now except for nighttime and early AM sx.  Hasn't taking ibuprofen recently.  Never had shoulder injected.    He snores more, with apneas noted by girlfriend.  Waking up tired.  No HA.  Tired.  Wants to nap but not falling asleep.    Meds, vitals, and allergies reviewed.   ROS: See HPI.  Otherwise, noncontributory.  nad ncat Mmm Op wnl 16.5"neck L shoulder w/o arm drop but pain with ext>int rotation with supraspinatus testing pos.  + scap assist.  No muscle wasting. AC not ttp Distally NV intact

## 2015-05-05 ENCOUNTER — Ambulatory Visit (INDEPENDENT_AMBULATORY_CARE_PROVIDER_SITE_OTHER): Payer: Managed Care, Other (non HMO) | Admitting: Family Medicine

## 2015-05-05 ENCOUNTER — Encounter: Payer: Self-pay | Admitting: Family Medicine

## 2015-05-05 VITALS — BP 134/90 | HR 80 | Temp 98.3°F | Ht 73.5 in | Wt 231.0 lb

## 2015-05-05 DIAGNOSIS — M7542 Impingement syndrome of left shoulder: Secondary | ICD-10-CM | POA: Diagnosis not present

## 2015-05-05 DIAGNOSIS — M7582 Other shoulder lesions, left shoulder: Secondary | ICD-10-CM

## 2015-05-05 DIAGNOSIS — M7552 Bursitis of left shoulder: Secondary | ICD-10-CM | POA: Diagnosis not present

## 2015-05-05 MED ORDER — METHYLPREDNISOLONE ACETATE 40 MG/ML IJ SUSP
80.0000 mg | Freq: Once | INTRAMUSCULAR | Status: AC
  Administered 2015-05-05: 80 mg via INTRA_ARTICULAR

## 2015-05-05 NOTE — Progress Notes (Signed)
Dr. Karleen Hampshire T. Aldyn Toon, MD, CAQ Sports [facility] [address] [place], [address] Phone: [phone] Fax: [phone]  05/05/2015  Patient: Andrew Mcconnell, MRN: [id number], DOB: 1970-10-15, 44 y.o.  Primary Physician:  Crawford Givens, MD   Chief Complaint  Patient presents with  . Shoulder Pain    Left   Subjective:   Andrew Mcconnell is a 44 y.o. very pleasant male patient who presents with the following:  The patient noted above presents with L shoulder pain that has been ongoing for 2-3 mo.  there is no history of trauma or accident. The patient denies neck pain or radicular symptoms. Denies dislocation, subluxation, separation of the shoulder. The patient does complain of pain in the overhead plane.  Slowly moving at night - L shoulder.   3 years ago, ? Working out - no.   Works in a [employer]. 50 pound boxes. 250 pound drums.  No neck pain.   Medications Tried: Alleve, ibuprofen Ice or Heat: No Tried PT: No  Prior shoulder Injury: L RTC issues 3 years ago Prior surgery: No Prior fracture: No  Past Medical History, Surgical History, Social History, Family History, Problem List, Medications, and Allergies have been reviewed and updated if relevant.  Patient Active Problem List   Diagnosis Date Noted  . Snoring 04/01/2015  . Abnormal CBC 04/16/2014  . Routine general medical examination at a health care facility 09/19/2012  . Plantar fasciitis 09/19/2012  . Transient elevated blood pressure 06/28/2012  . ED (erectile dysfunction) 06/28/2012  . Shoulder pain 10/04/2011  . Prostate cancer screening 04/15/2011  . FH: diabetes mellitus 04/15/2011  . Hemorrhoids 04/15/2011    Past Medical History  Diagnosis Date  . Blood in stool   . History of chicken pox   . Allergy   . Hemorrhoids, external     No past surgical history on file.  Social History   Social History  . Marital Status: Single    Spouse Name: N/A   . Number of Children: N/A  . Years of Education: N/A   Occupational History  . Not on file.   Social History Main Topics  . Smoking status: Never Smoker   . Smokeless tobacco: Never Used  . Alcohol Use: 0.0 oz/week    0 Standard drinks or equivalent per week     Comment: occ  . Drug Use: No  . Sexual Activity: Not on file   Other Topics Concern  . Not on file   Social History Narrative   Education:  High school   [employer] fan   Coached youth league basketball   Works with wireless network install   3 kids    Family History  Problem Relation Age of Onset  . Cancer Mother     cervical  . Hypertension Other   . Diabetes Other   . Prostate cancer Neg Hx   . Colon cancer Neg Hx     No Known Allergies  Medication list reviewed and updated in full in [facility].  GEN: No fevers, chills. Nontoxic. Primarily MSK c/o today. MSK: Detailed in the HPI GI: tolerating PO intake without difficulty Neuro: No numbness, parasthesias, or tingling associated. Otherwise the pertinent positives of the ROS are noted above.   Objective:   BP 134/90 mmHg  Pulse 80  Temp(Src) 98.3 F (36.8 C) (Oral)  Ht 6' 1.5" (1.867 m)  Wt 231 lb (104.781 kg)  BMI 30.06 kg/m2  GEN: Well-developed,well-nourished,in no acute distress; alert,appropriate and cooperative throughout examination HEENT: Normocephalic and atraumatic without obvious abnormalities. Ears, externally no deformities PULM: Breathing comfortably in no respiratory distress EXT: No clubbing, cyanosis, or edema PSYCH: Normally interactive. Cooperative during the interview. Pleasant. Friendly and conversant. Not anxious or depressed appearing. Normal, full affect.  Shoulder: L Inspection: No muscle wasting or winging Ecchymosis/edema: neg  AC joint, scapula, clavicle: NT Cervical spine: NT, full ROM Spurling's: neg Abduction: full, 5/5 Flexion: full, 5/5 IR, full, lift-off: 5/5 ER at neutral: full, 5/5 AC crossover:  POS Neer: pos Hawkins: pos Drop Test: neg Empty Can: pos Supraspinatus insertion: mild-mod T Bicipital groove: NT Speed's: neg Yergason's: neg Sulcus sign: neg Scapular dyskinesis: none C5-T1 intact  Neuro: Sensation intact Grip 5/5   Radiology: No results found.  Assessment and Plan:   Shoulder impingement, left  Rotator cuff tendonitis, left  Subacromial bursitis, left  Shoulder anatomy was reviewed with the patient using and anatomical model. Likely chest / scapular imbalance contributing with years of overhead work.  Rotator cuff strengthening and scapular stabilization exercises were reviewed with the patient.  [facility] RTC and scapular stabilization program given to the patient. Retraining shoulder mechanics and function was emphasized to the patient with rehab done at least 5-6 days a week.  The patient could benefit from formal PT to assist with scapular stabilization and RTC strengthening.   SubAC Injection, L Verbal consent was obtained from the patient. Risks (including rare infection), benefits, and alternatives were explained. Patient prepped with Chloraprep and Ethyl Chloride used for anesthesia. The subacromial space was injected using the posterior approach. The patient tolerated the procedure well and had decreased pain post injection. No complications. Injection: 8 cc of Lidocaine 1% and 2 mL of Depo-Medrol 40 mg. Needle: 22 gauge   Follow-up: 2 mo. If better, no f/u needed.  Signed,  Elpidio Galea. Braelyn Jenson, MD   Patient's Medications  New Prescriptions   No medications on file  Previous Medications   IBUPROFEN (ADVIL,MOTRIN) 600 MG TABLET    Take 1 tablet (600 mg total) by mouth every 8 (eight) hours as needed (for pain.  with food.).   MULTIPLE VITAMIN (MULTIVITAMIN) TABLET    Take 1 tablet by mouth daily.     TADALAFIL (CIALIS) 20 MG TABLET    Take 0.5-1 tablets (10-20 mg total) by mouth every other day as needed for erectile dysfunction.  Modified  Medications   No medications on file  Discontinued Medications   No medications on file

## 2015-05-05 NOTE — Progress Notes (Signed)
Pre visit review using our clinic review tool, if applicable. No additional management support is needed unless otherwise documented below in the visit note. 

## 2015-05-08 ENCOUNTER — Encounter (INDEPENDENT_AMBULATORY_CARE_PROVIDER_SITE_OTHER): Payer: Self-pay

## 2015-05-08 ENCOUNTER — Encounter: Payer: Self-pay | Admitting: Internal Medicine

## 2015-05-08 ENCOUNTER — Ambulatory Visit (INDEPENDENT_AMBULATORY_CARE_PROVIDER_SITE_OTHER): Payer: Managed Care, Other (non HMO) | Admitting: Internal Medicine

## 2015-05-08 VITALS — BP 142/78 | HR 75 | Ht 74.0 in | Wt 237.5 lb

## 2015-05-08 DIAGNOSIS — G4719 Other hypersomnia: Secondary | ICD-10-CM | POA: Diagnosis not present

## 2015-05-08 DIAGNOSIS — R41840 Attention and concentration deficit: Secondary | ICD-10-CM

## 2015-05-08 DIAGNOSIS — R0683 Snoring: Secondary | ICD-10-CM | POA: Diagnosis not present

## 2015-05-08 NOTE — Patient Instructions (Signed)
Will send for sleep study.    Sleep Apnea Sleep apnea is disorder that affects a person's sleep. A person with sleep apnea has abnormal pauses in their breathing when they sleep. It is hard for them to get a good sleep. This makes a person tired during the day. It also can lead to other physical problems. There are three types of sleep apnea. One type is when breathing stops for a short time because your airway is blocked (obstructive sleep apnea). Another type is when the brain sometimes fails to give the normal signal to breathe to the muscles that control your breathing (central sleep apnea). The third type is a combination of the other two types. HOME CARE   Take all medicine as told by your doctor.  Avoid alcohol, calming medicines (sedatives), and depressant drugs.  Try to lose weight if you are overweight. Talk to your doctor about a healthy weight goal.  Your doctor may have you use a device that helps to open your airway. It can help you get the air that you need. It is called a positive airway pressure (PAP) device.   MAKE SURE YOU:   Understand these instructions.  Will watch your condition.  Will get help right away if you are not doing well or get worse.  It may take approximately 1 month for you to get used to wearing her CPAP every night.   

## 2015-05-08 NOTE — Progress Notes (Signed)
[facility] Consultation      Assessment and Plan:  Excessive daytime sleepiness. -Symptoms and signs of obstructive sleep apnea, we'll send for sleep study.  Snoring.  -Loud snoring with witnessed apneas. As above, we'll send for sleep study. -If Sleep study is negative, will consider a dental device to help reduce his snoring.  Difficulty in concentration. -Likely due to sleep apnea, we'll send for sleep study.  Date: 05/08/2015  MRN# [id number] Andrew Mcconnell 1970-05-11  Referring Physician: Karleen Hampshire Copland  Andrew Mcconnell is a 44 y.o. old male seen in consultation for chief complaint of:    Chief Complaint  Patient presents with  . SLEEP CONSULT    pt.  ref by dr. Para March for loud snoring. pt. states he has loud snoring. daytime sleepiness. friend states he looks like he stops breathing while sleeping.  EPWORTH score:16    HPI:   The patient is a 44 year old male referred for symptoms of excessive daytime sleepiness and loud snoring.  Wife is present and notes that he is snoring and stops breathing in his sleep. He snores loudly, this has been going on for at least 3 years, and he makes choking and gurgling sound.  He does not daytime sleepiness, when wakes in morning sometimes can feel still tired. He works driving a Chief Executive Officer. He notes trouble with concentration and focusing.  He will fall asleep when sitting on the couch.   Patient goes to bed between 10 and 11 PM, he takes between 3-5 minutes to fall sleep, he wakes up 3-5 times per night. His typically out of had a bed at 7 AM. His Epworth score is 16 today. On mornings he does not work he wakes at same time. He typically wakes and 5 am and lays in bed until 7 am.     PMHX:   Past Medical History  Diagnosis Date  . Blood in stool   . History of chicken pox   . Allergy   . Hemorrhoids, external    Surgical Hx:  No past surgical history on file. Family Hx:  Family History  Problem  Relation Age of Onset  . Cancer Mother     cervical  . Hypertension Other   . Diabetes Other   . Prostate cancer Neg Hx   . Colon cancer Neg Hx    Social Hx:   Social History  Substance Use Topics  . Smoking status: Never Smoker   . Smokeless tobacco: Never Used  . Alcohol Use: 0.0 oz/week    0 Standard drinks or equivalent per week     Comment: occ   Medication:   Current Outpatient Rx  Name  Route  Sig  Dispense  Refill  . ibuprofen (ADVIL,MOTRIN) 600 MG tablet   Oral   Take 1 tablet (600 mg total) by mouth every 8 (eight) hours as needed (for pain.  with food.).   30 tablet   1   . Multiple Vitamin (MULTIVITAMIN) tablet   Oral   Take 1 tablet by mouth daily.           . tadalafil (CIALIS) 20 MG tablet   Oral   Take 0.5-1 tablets (10-20 mg total) by mouth every other day as needed for erectile dysfunction.   5 tablet   11       Allergies:  Review of patient's allergies indicates no known allergies.  Review of Systems: Gen:  Denies  fever, sweats, chills HEENT: Denies blurred vision, double  vision. bleeds, sore throat Cvc:  No dizziness, chest pain. Resp:   Denies cough or sputum porduction, shortness of breath Gi: Denies swallowing difficulty, stomach pain. Gu:  Denies bladder incontinence, burning urine Ext:   No Joint pain, stiffness. Skin: No skin rash,  hives Endoc:  No polyuria, polydipsia. Psych: No depression, insomnia. Other:  All other systems were reviewed with the patient and were negative other that what is mentioned in the HPI.   Physical Examination:   VS: BP 142/78 mmHg  Pulse 75  Ht 6\' 2"  (1.88 m)  Wt 237 lb 8 oz (107.729 kg)  BMI 30.48 kg/m2  SpO2 99%  General Appearance: No distress  Neuro:without focal findings,  speech normal,  HEENT: PERRLA, EOM intact.  Mallampati 3 Pulmonary: normal breath sounds, No wheezing.  CardiovascularNormal S1,S2.  No m/r/g.   Abdomen: Benign, Soft, non-tender. Renal:  No costovertebral tenderness   GU:  No performed at this time. Endoc: No evident thyromegaly, no signs of acromegaly. Skin:   warm, no rashes, no ecchymosis  Extremities: normal, no cyanosis, clubbing.  Other findings:    LABORATORY PANEL:   CBC No results for input(s): WBC, HGB, HCT, PLT in the last 168 hours. ------------------------------------------------------------------------------------------------------------------  Chemistries  No results for input(s): NA, K, CL, CO2, GLUCOSE, BUN, CREATININE, CALCIUM, MG, AST, ALT, ALKPHOS, BILITOT in the last 168 hours.  Invalid input(s): GFRCGP ------------------------------------------------------------------------------------------------------------------  Cardiac Enzymes No results for input(s): TROPONINI in the last 168 hours. ------------------------------------------------------------  RADIOLOGY:  No results found.     Thank  you for the consultation and for allowing [facility] to assist in the care of your patient. Our recommendations are noted above.  Please contact us if we can be of further service.   Wells Guiles, MD.  Board Certified in Internal Medicine, Pulmonary Medicine, Critical Care Medicine, and Sleep Medicine.  [facility] Office Number: [phone]  Santiago Glad, M.D.  Stephanie Acre, M.D.  Billy Fischer, M.D

## 2015-05-08 NOTE — Addendum Note (Signed)
Addended by: Meyer CoryAHMAD, MISTY R on: 05/08/2015 11:16 AM   Modules accepted: Orders

## 2015-05-17 DIAGNOSIS — G4733 Obstructive sleep apnea (adult) (pediatric): Secondary | ICD-10-CM | POA: Diagnosis not present

## 2015-05-22 ENCOUNTER — Telehealth: Payer: Self-pay | Admitting: Internal Medicine

## 2015-05-22 DIAGNOSIS — G4733 Obstructive sleep apnea (adult) (pediatric): Secondary | ICD-10-CM

## 2015-05-22 NOTE — Telephone Encounter (Signed)
Pt cb [phone]

## 2015-05-22 NOTE — Telephone Encounter (Signed)
Spoke with Andrew Mcconnell, together we called Andrew Mcconnell in BT to see who reads the alice studies in BT.  Andrew LoserRhonda advised we check with Andrew Mcconnell to see who has/is scheduled to read this study.    Andrew Mcconnell, please advise if you know who is/has read this study.  Thanks!

## 2015-05-22 NOTE — Telephone Encounter (Signed)
Triage please check with Chan. I read one here recenJohny Drillingtly from Dr Nicholos Johnsamachandran, don't remember patient name. I give results to Johny DrillingChan to put into system and results should be accessible in Epic for Dr R as soon as scanned in.

## 2015-05-22 NOTE — Telephone Encounter (Signed)
Pt asking for sleep study results: Can't find results in computer, please obtain sleep study results from sleep lab. Thanks.

## 2015-05-22 NOTE — Telephone Encounter (Signed)
Dr Nicholos Johnsamachandran please advise on sleep study results. Thanks.  LM for pt to make aware that we are asking Dr Nicholos Johnsamachandran for results.

## 2015-05-22 NOTE — Telephone Encounter (Signed)
Pt had a home sleep test so this would not be available through sleep lab, one of our sleep docs would read these.   CY please advise if you've read this.  Thanks!

## 2015-05-26 DIAGNOSIS — G4733 Obstructive sleep apnea (adult) (pediatric): Secondary | ICD-10-CM | POA: Diagnosis not present

## 2015-05-26 NOTE — Telephone Encounter (Signed)
This HST was given to Dr. Vassie Loll on 05/20/2015 for him to review. When I looked in his box I only saw 1 HST and it was not on this patient. I don't know what has happened to the copy I put in Dr. Vassie Loll box. I have also asked Medical Records if the have the HST and they don't have it yet to be scanned in

## 2015-05-26 NOTE — Telephone Encounter (Signed)
Anita please advise.  Thanks! 

## 2015-05-27 ENCOUNTER — Other Ambulatory Visit: Payer: Self-pay | Admitting: *Deleted

## 2015-05-27 DIAGNOSIS — G4719 Other hypersomnia: Secondary | ICD-10-CM

## 2015-05-27 DIAGNOSIS — R0683 Snoring: Secondary | ICD-10-CM

## 2015-05-28 NOTE — Telephone Encounter (Signed)
RA please advise if you have received/reviewed pt's sleep study. Thanks.

## 2015-05-28 NOTE — Telephone Encounter (Signed)
I read this study some time back

## 2015-05-29 NOTE — Telephone Encounter (Signed)
Results are not in Epic.  Please advise on sleep study results.  Thanks.

## 2015-05-29 NOTE — Telephone Encounter (Signed)
HST results faxed to Dr. Nicholos Johns this morning.  Message sent to Dr. Nicholos Johns that the study has been received and to please advise.  Rhonda J Cobb

## 2015-06-01 NOTE — Telephone Encounter (Signed)
I spoke with patient about results and he verbalized understanding and had no questions. Order for CPAP titration placed. Nothing further needed

## 2015-06-01 NOTE — Telephone Encounter (Signed)
Please advise Dr. Nicholos Johns. thanks

## 2015-06-01 NOTE — Telephone Encounter (Signed)
[phone] calling back

## 2015-06-01 NOTE — Telephone Encounter (Signed)
Sleep study positive with AHI of 17; please send for CPAP titration study (in-lab).

## 2015-06-01 NOTE — Telephone Encounter (Signed)
[id number] x1 

## 2015-06-09 ENCOUNTER — Ambulatory Visit (INDEPENDENT_AMBULATORY_CARE_PROVIDER_SITE_OTHER): Payer: Managed Care, Other (non HMO) | Admitting: Family Medicine

## 2015-06-09 ENCOUNTER — Encounter: Payer: Self-pay | Admitting: Family Medicine

## 2015-06-09 VITALS — BP 122/64 | HR 71 | Temp 98.5°F | Ht 74.0 in | Wt 232.0 lb

## 2015-06-09 DIAGNOSIS — Z0000 Encounter for general adult medical examination without abnormal findings: Secondary | ICD-10-CM

## 2015-06-09 DIAGNOSIS — Z8639 Personal history of other endocrine, nutritional and metabolic disease: Secondary | ICD-10-CM | POA: Diagnosis not present

## 2015-06-09 DIAGNOSIS — Z7189 Other specified counseling: Secondary | ICD-10-CM

## 2015-06-09 DIAGNOSIS — Z119 Encounter for screening for infectious and parasitic diseases, unspecified: Secondary | ICD-10-CM | POA: Diagnosis not present

## 2015-06-09 DIAGNOSIS — R0683 Snoring: Secondary | ICD-10-CM

## 2015-06-09 LAB — LIPID PANEL
CHOLESTEROL: 227 mg/dL — AB (ref 0–200)
HDL: 55.4 mg/dL (ref >39.00)
LDL Cholesterol: 143 mg/dL — ABNORMAL HIGH (ref 0–99)
NonHDL: 171.45
TRIGLYCERIDES: 141 mg/dL (ref 0.0–149.0)
Total CHOL/HDL Ratio: 4
VLDL: 28.2 mg/dL (ref 0.0–40.0)

## 2015-06-09 LAB — COMPREHENSIVE METABOLIC PANEL
ALBUMIN: 4.3 g/dL (ref 3.5–5.2)
ALT: 39 U/L (ref 0–53)
AST: 26 U/L (ref 0–37)
Alkaline Phosphatase: 68 U/L (ref 39–117)
BILIRUBIN TOTAL: 1.8 mg/dL — AB (ref 0.2–1.2)
BUN: 16 mg/dL (ref 6–23)
CALCIUM: 9.8 mg/dL (ref 8.4–10.5)
CHLORIDE: 102 meq/L (ref 96–112)
CO2: 31 mEq/L (ref 19–32)
CREATININE: 1.31 mg/dL (ref 0.40–1.50)
GFR: 76.14 mL/min (ref >60.00)
Glucose, Bld: 98 mg/dL (ref 70–99)
Potassium: 3.9 mEq/L (ref 3.5–5.1)
Sodium: 138 mEq/L (ref 135–145)
Total Protein: 8.1 g/dL (ref 6.0–8.3)

## 2015-06-09 MED ORDER — TADALAFIL 5 MG PO TABS: 5.0000 mg | ORAL_TABLET | Freq: Every day | ORAL | Status: DC

## 2015-06-09 NOTE — Patient Instructions (Signed)
Go to the lab on the way out.  We'll contact you with your lab report. Change to daily cialis.  See if that helps with nighttime urination.  Update me as needed.  Take care.  Glad to see you.

## 2015-06-09 NOTE — Progress Notes (Signed)
Pre visit review using our clinic review tool, if applicable. No additional management support is needed unless otherwise documented below in the visit note.  CPE- See plan.  Routine anticipatory guidance given to patient.  See health maintenance. Tetanus 2012 PNA and shingles not due.   Flu shot encouraged.  PSA prev wnl. No FH.  D/w pt about no screening due now.   Colon cancer screening not due. He has episodic hemorrhoid sx in the past, had some bright red blood once about 2 weeks ago without pain.  None since then.   Diet and exercise discussed. "has been up and down."   Living will d/w pt.  Andrew Mcconnell would be designated if patient were incapacitated.   OSA d/w pt.  He has f/u pending.  I gave him a copy of his sleep study and d/w pt.  He is going to work on weight loss.   ED. Relief with cialis.  Taken prn.  LUTS/nocturia improved with the nights he takes the med.    Heartburn worse recently.  Taking nexium with relief, taken daily.  Started about 3 weeks ago.  Rare ibuprofen.  No hematemesis.  No sig changes in life recently.  Unclear if OSA is affecting his GERD sx.  Goal weight loss down to 220.  D/w pt.  Mood has been variable.  He can get irritated but no SI/HI.    PMH and SH reviewed  Meds, vitals, and allergies reviewed.   ROS: See HPI.  Otherwise negative.    GEN: nad, alert and oriented HEENT: mucous membranes moist NECK: supple w/o LA CV: rrr. PULM: ctab, no inc wob ABD: soft, +bs EXT: no edema SKIN: no acute rash

## 2015-06-10 ENCOUNTER — Telehealth: Payer: Self-pay | Admitting: Family Medicine

## 2015-06-10 DIAGNOSIS — Z7189 Other specified counseling: Secondary | ICD-10-CM | POA: Insufficient documentation

## 2015-06-10 LAB — GC/CHLAMYDIA PROBE AMP
CT Probe RNA: NOT DETECTED
GC PROBE AMP APTIMA: NOT DETECTED

## 2015-06-10 LAB — HIV ANTIBODY (ROUTINE TESTING W REFLEX): HIV: NONREACTIVE

## 2015-06-10 LAB — RPR

## 2015-06-10 NOTE — Telephone Encounter (Signed)
Patient returned Regina's call. °

## 2015-06-10 NOTE — Assessment & Plan Note (Addendum)
Tetanus 2012 PNA and shingles not due.   Flu shot encouraged.  PSA prev wnl. No FH.  D/w pt about no screening due now.   Colon cancer screening not due. He has episodic hemorrhoid sx in the past, had some bright red blood once about 2 weeks ago without pain.  None since then.  He is low risk.  If sx continue then we can address.  Diet and exercise discussed. "has been up and down."   Living will d/w pt.  Andrew Mcconnell would be designated if patient were incapacitated.  STD screening done, no sx. See notes on labs.

## 2015-06-10 NOTE — Telephone Encounter (Signed)
Called patient back and lab results were given. 

## 2015-06-10 NOTE — Assessment & Plan Note (Signed)
He'll work on weight loss and f/u with pulmonary for CPAP fit/etc.  Diet and exercise, sleep improvement may help heartburn and mood.  Okay to continue PPI in the meantime.  D/w pt. Still okay for outpatient f/u.

## 2015-06-10 NOTE — Assessment & Plan Note (Signed)
Living will d/w pt.  Andrew Mcconnell would be designated if patient were incapacitated.  

## 2015-06-10 NOTE — Assessment & Plan Note (Signed)
With LUTS, change to daily cialis and update me as needed.

## 2015-06-15 ENCOUNTER — Encounter: Payer: Self-pay | Admitting: Internal Medicine

## 2015-06-15 ENCOUNTER — Ambulatory Visit (HOSPITAL_BASED_OUTPATIENT_CLINIC_OR_DEPARTMENT_OTHER): Payer: Managed Care, Other (non HMO) | Attending: Internal Medicine | Admitting: Radiology

## 2015-06-15 VITALS — Ht 74.0 in | Wt 230.0 lb

## 2015-06-15 DIAGNOSIS — G4730 Sleep apnea, unspecified: Secondary | ICD-10-CM

## 2015-06-15 DIAGNOSIS — G4733 Obstructive sleep apnea (adult) (pediatric): Secondary | ICD-10-CM | POA: Diagnosis not present

## 2015-06-16 ENCOUNTER — Encounter (HOSPITAL_BASED_OUTPATIENT_CLINIC_OR_DEPARTMENT_OTHER): Payer: Managed Care, Other (non HMO)

## 2015-06-24 ENCOUNTER — Telehealth: Payer: Self-pay | Admitting: Internal Medicine

## 2015-06-24 DIAGNOSIS — G4733 Obstructive sleep apnea (adult) (pediatric): Secondary | ICD-10-CM

## 2015-06-24 NOTE — Telephone Encounter (Signed)
Order placed. Unaware of titration being done.   Andrew Mcconnell, Order placed for CPAP.

## 2015-06-24 NOTE — Progress Notes (Signed)
Patient Name: Andrew Mcconnell, Andrew Mcconnell Date: 06/15/2015 Gender: Male D.O.B: 09/18/1970 Age (years): 44 Referring Provider: Shane Crutch Height (inches): 74 Interpreting Physician: Cyril Mourning MD, ABSM Weight (lbs): 230 RPSGT: Armen Pickup BMI: 30 MRN: [id number] Neck Size: 17.00   CLINICAL INFORMATION The patient is referred for a CPAP titration to treat sleep apnea. Date of  HST:05/2015, showed AHI 17/hour   SLEEP STUDY TECHNIQUE As per the AASM Manual for the Scoring of Sleep and Associated Events v2.3 (April 2016) with a hypopnea requiring 4% desaturations. The channels recorded and monitored were frontal, central and occipital EEG, electrooculogram (EOG), submentalis EMG (chin), nasal and oral airflow, thoracic and abdominal wall motion, anterior tibialis EMG, snore microphone, electrocardiogram, and pulse oximetry. Continuous positive airway pressure (CPAP) was initiated at the beginning of the study and titrated to treat sleep-disordered breathing.   MEDICATIONS Medications administered by patient during sleep study : No sleep medicine administered.   RESPIRATORY PARAMETERS Optimal PAP Pressure (cm): 5 AHI at Optimal Pressure (/hr): 0.0 Overall Minimal O2 (%): 92.00 Supine % at Optimal Pressure (%): 50 Minimal O2 at Optimal Pressure (%): 92.0     SLEEP ARCHITECTURE The study was initiated at 9:58:09 PM and ended at 4:40:54 AM. Sleep onset time was 12.7 minutes and the sleep efficiency was 86.8%. The total sleep time was 349.5 minutes. The patient spent 3.43% of the night in stage N1 sleep, 55.22% in stage N2 sleep, 0.00% in stage N3 and 41.34% in REM.Stage REM latency was 84.5 minutes Wake after sleep onset was 40.5. Alpha intrusion was absent. Supine sleep was 50.36%.   CARDIAC DATA The 2 lead EKG demonstrated sinus rhythm. The mean heart rate was 70.83 beats per minute. Other EKG findings include: None.   LEG MOVEMENT DATA The total Periodic Limb Movements of  Sleep (PLMS) were 25. The PLMS index was 4.29. A PLMS index of <15 is considered normal in adults.  IMPRESSIONS - The optimal PAP pressure was 5 cm of water. - Central sleep apnea was not noted during this titration (CAI = 0.0/h). - Significant oxygen desaturations were not observed during this titration (min O2 = 92.00%). - No snoring was audible during this study. - No cardiac abnormalities were observed during this study. - Clinically significant periodic limb movements were not noted during this study. Arousals associated with PLMs were rare.   DIAGNOSIS - Obstructive Sleep Apnea (327.23 [G47.33 ICD-10])   RECOMMENDATIONS - Trial of CPAP therapy on 5 cm H2O with a Medium size Resmed Full Face Mask AirFit F20 mask and heated humidification. - Avoid alcohol, sedatives and other CNS depressants that may worsen sleep apnea and disrupt normal sleep architecture. - Sleep hygiene should be reviewed to assess factors that may improve sleep quality. - Weight management and regular exercise should be initiated or continued. - Return to Sleep Center for re-evaluation after 4 weeks of therapy  Cyril Mourning MD. FCCP. [facility]

## 2015-06-24 NOTE — Telephone Encounter (Signed)
ATC patient and no answer. LMOAM for pt to return my call in reference to his CPAP order.  Rhonda J Cobb

## 2015-06-25 NOTE — Telephone Encounter (Signed)
Pt returned call and stated that he wanted CPAP Referral sent to [facility].  Order faxed to [facility] and pt is aware to contact us back if he has not heard from [place] in 1 week. Pt voiced understanding. Nothing else needed at this time. Rhonda J Cobb

## 2015-07-27 ENCOUNTER — Ambulatory Visit (HOSPITAL_BASED_OUTPATIENT_CLINIC_OR_DEPARTMENT_OTHER): Payer: Managed Care, Other (non HMO)

## 2015-08-06 ENCOUNTER — Encounter: Payer: Self-pay | Admitting: Internal Medicine

## 2015-08-07 ENCOUNTER — Encounter: Payer: Self-pay | Admitting: Internal Medicine

## 2015-08-07 ENCOUNTER — Ambulatory Visit (INDEPENDENT_AMBULATORY_CARE_PROVIDER_SITE_OTHER): Payer: Managed Care, Other (non HMO) | Admitting: Internal Medicine

## 2015-08-07 VITALS — BP 142/78 | HR 75 | Ht 74.0 in | Wt 229.0 lb

## 2015-08-07 DIAGNOSIS — G4733 Obstructive sleep apnea (adult) (pediatric): Secondary | ICD-10-CM | POA: Diagnosis not present

## 2015-08-07 NOTE — Progress Notes (Signed)
[facility]     Assessment and Plan:  Obstructive sleep apnea. -Appears adequately controlled on most recent download data, we discussed increasing his CPAP use to 100% of nights, and making a part of his nightly routine.  Snoring.  -Improved/resolved with CPAP.    Date: 08/07/2015  MRN# [id number] DRAYLON FEBRES [birth year]   Andrew Mcconnell is a 45 y.o. old male seen in follow up for chief complaint of  Chief Complaint  Patient presents with  . Follow-up    pt. states he wears CPAP 4-7hr everynight. had sore on nose was unable X2d. feels pressure isn't strong enough. no supplies needed. DME: [facility].     HPI:   The patient is a 45 year old male, last seen in the office for complaints of excessive daytime sleepiness, he was sent for a home sleep study. Review of home sleep study report and tracing showed an apnea popping index of 17. Subsequently underwent a CPAP titration study, and started on a CPAP of 5. Review of the patient's download tracings and data, the patient appears to use his CPAP for nearly 6 hours. On the days used, however, he only used it two thirds of days. On the days used it appears that his sleep apnea was effectively controlled, with a residual AHI of less than 1.  Wife notes some occasional snoring when he is first falling asleep, explained that this may be due to the CPAP initially on the ramp setting. She notes no further snoring later in the night.  Medication:   Outpatient Encounter Prescriptions as of 08/07/2015  Medication Sig  . esomeprazole (NEXIUM) 20 MG capsule Take 20 mg by mouth daily.  Marland Kitchen ibuprofen (ADVIL,MOTRIN) 600 MG tablet Take 1 tablet (600 mg total) by mouth every 8 (eight) hours as needed (for pain.  with food.).  Marland Kitchen Multiple Vitamin (MULTIVITAMIN) tablet Take 1 tablet by mouth daily.    . tadalafil (CIALIS) 5 MG tablet Take 1 tablet (5 mg total) by mouth daily.   No facility-administered encounter medications on  file as of 08/07/2015.     Allergies:  Review of patient's allergies indicates no known allergies.  Review of Systems: Gen:  Denies  fever, sweats. HEENT: Denies blurred vision. Cvc:  No dizziness, chest pain or heaviness Resp:   Denies cough or sputum porduction. Gi: Denies swallowing difficulty, stomach pain. constipation, bowel incontinence Gu:  Denies bladder incontinence, burning urine Ext:   No Joint pain, stiffness. Skin: No skin rash, easy bruising. Endoc:  No polyuria, polydipsia. Psych: No depression, insomnia. Other:  All other systems were reviewed and found to be negative other than what is mentioned in the HPI.   Physical Examination:   VS: BP 142/78 mmHg  Pulse 75  Ht 6\' 2"  (1.88 m)  Wt 229 lb (103.874 kg)  BMI 29.39 kg/m2  SpO2 100%  General Appearance: No distress  Neuro:without focal findings,  speech normal,  HEENT: PERRLA, EOM intact. Pulmonary: normal breath sounds, No wheezing.   CardiovascularNormal S1,S2.  No m/r/g.   Abdomen: Benign, Soft, non-tender. Renal:  No costovertebral tenderness  GU:  Not performed at this time. Endoc: No evident thyromegaly, no signs of acromegaly. Skin:   warm, no rash. Extremities: normal, no cyanosis, clubbing.   LABORATORY PANEL:   CBC No results for input(s): WBC, HGB, HCT, PLT in the last 168 hours. ------------------------------------------------------------------------------------------------------------------  Chemistries  No results for input(s): NA, K, CL, CO2, GLUCOSE, BUN, CREATININE, CALCIUM, MG, AST, ALT, ALKPHOS,  BILITOT in the last 168 hours.  Invalid input(s): GFRCGP ------------------------------------------------------------------------------------------------------------------  Cardiac Enzymes No results for input(s): TROPONINI in the last 168 hours. ------------------------------------------------------------  RADIOLOGY:   No results found for this or any previous visit. No results  found for this or any previous visit. ------------------------------------------------------------------------------------------------------------------  Thank  you for allowing [facility] to assist in the care of your patient. Our recommendations are noted above.  Please contact us if we can be of further service.   Wells Guiles, MD.  [facility] Office Number: [phone]  Santiago Glad, M.D.  Stephanie Acre, M.D.  Billy Fischer, M.D  08/07/2015

## 2015-08-07 NOTE — Patient Instructions (Signed)
Continue to try to use your CPAP every night.   Contact us if you notice snoring or increasing sleepiness.

## 2015-08-12 ENCOUNTER — Other Ambulatory Visit: Payer: Self-pay | Admitting: Occupational Medicine

## 2015-08-12 ENCOUNTER — Ambulatory Visit: Payer: Self-pay

## 2015-08-12 DIAGNOSIS — Z0000 Encounter for general adult medical examination without abnormal findings: Secondary | ICD-10-CM

## 2015-09-10 ENCOUNTER — Telehealth: Payer: Self-pay

## 2015-09-10 NOTE — Telephone Encounter (Signed)
Pt said [facility] [address] [facility] advised pt that PA is needed for cialis. Pt request cb.

## 2015-09-11 NOTE — Telephone Encounter (Signed)
Fax received stating that this medication does not require a PA.  Notification was [facility]ed to [facility] [address].

## 2015-09-11 NOTE — Telephone Encounter (Signed)
Submitted PA thru CMM, awaiting response. 

## 2015-09-22 NOTE — Telephone Encounter (Signed)
Pt left v/m requesting status of cialis PA. [facility] [address] and advised ins will allow # 24 for 90 day period; can fill # 24 or do # 8 for 3 months each. Left v/m requesting pt to cb and pt can contact [facility] about whether wants monthly cialis or 3 month rx.

## 2015-10-16 NOTE — Telephone Encounter (Signed)
Pt notified as instructed and pt voiced understanding. 

## 2016-01-12 ENCOUNTER — Ambulatory Visit (INDEPENDENT_AMBULATORY_CARE_PROVIDER_SITE_OTHER): Payer: Managed Care, Other (non HMO) | Admitting: Family Medicine

## 2016-01-12 ENCOUNTER — Encounter: Payer: Self-pay | Admitting: Family Medicine

## 2016-01-12 DIAGNOSIS — L609 Nail disorder, unspecified: Secondary | ICD-10-CM

## 2016-01-12 MED ORDER — CEPHALEXIN 500 MG PO CAPS: 500.0000 mg | ORAL_CAPSULE | Freq: Four times a day (QID) | ORAL | 0 refills | Status: DC

## 2016-01-12 NOTE — Patient Instructions (Signed)
Soak for 5-10 minutes in warm salt water a few times a day.  Start keflex in the meantime.  Let me know if you need another note for work.  Take care.  Glad to see you.

## 2016-01-12 NOTE — Progress Notes (Signed)
L foot, 1st toe.  Has to wear steel toe boots at work.  Swelling some at night, after work.  Started a few weeks ago.  Painful, but better in the last few days when persistently barefoot.  No R foot troubles.  No injury.  No h/o gout.    Meds, vitals, and allergies reviewed.   ROS: Per HPI unless specifically indicated in ROS section   nad Able to bear weight.  Left foot with normal inspection except for L medial 1st nail irritated, no fluctuant mass.  No spreading erythema. No collection of pus noted or palpated. Normal dorsalis pedis pulse.

## 2016-01-12 NOTE — Progress Notes (Signed)
Pre visit review using our clinic review tool, if applicable. No additional management support is needed unless otherwise documented below in the visit note. 

## 2016-01-13 DIAGNOSIS — L609 Nail disorder, unspecified: Secondary | ICD-10-CM | POA: Insufficient documentation

## 2016-01-13 NOTE — Assessment & Plan Note (Signed)
Medial irritation, but no fluctuant area of pus accumulation. Would not need I and D at this point. Discussed with patient. I held him out ofwork, because it hurts to walk. He has to wear steel toed boots at work and this is uncomfortable. In the meantime start Keflex, soak foot in warm salt water several times a day. Update me as needed. He agrees.

## 2016-01-18 ENCOUNTER — Telehealth: Payer: Self-pay

## 2016-01-18 NOTE — Telephone Encounter (Signed)
Have them send the forms for me to extend the time out of work.   Is he getting any better?  If not, then I need to recheck the foot.   Thanks.

## 2016-01-18 NOTE — Telephone Encounter (Signed)
Pt left v/m; pt is scheduled to return to work on 01/19/16; foot is still too sore for pt to wear steel toed shoes and request extension of time off from work. Pt request cb.

## 2016-01-18 NOTE — Telephone Encounter (Signed)
Patient notified as instructed by telephone and verbalized understanding. Patient stated that his foot is getting better, but he wore some shoes to church yesterday that may it a little sore. Patient stated that he will have the form faxed over from his work to be completed.

## 2016-01-19 NOTE — Telephone Encounter (Signed)
I work on the hard copy when it comes in. Thanks.

## 2016-01-25 ENCOUNTER — Ambulatory Visit (INDEPENDENT_AMBULATORY_CARE_PROVIDER_SITE_OTHER): Payer: Managed Care, Other (non HMO) | Admitting: Family Medicine

## 2016-01-25 ENCOUNTER — Encounter: Payer: Self-pay | Admitting: Family Medicine

## 2016-01-25 DIAGNOSIS — L609 Nail disorder, unspecified: Secondary | ICD-10-CM

## 2016-01-25 MED ORDER — CEPHALEXIN 500 MG PO CAPS: 500.0000 mg | ORAL_CAPSULE | Freq: Four times a day (QID) | ORAL | 0 refills | Status: DC

## 2016-01-25 NOTE — Assessment & Plan Note (Signed)
The toe didn't look infected but the medial portion of the nail needed to be removed.  This was done after discussion with patient.  He agreed it needed to come out, especially after seeing the small linear fragment afterward.  The callus was trimmed down.  Restart kelfex given the manipulation today at OV.  Soak toe BID in warm salt water and use a bandaid for cushion.  Trim callus as tolerated.   Should improve.  Work note given.  No complications.

## 2016-01-25 NOTE — Progress Notes (Signed)
Pre visit review using our clinic review tool, if applicable. No additional management support is needed unless otherwise documented below in the visit note. 

## 2016-01-25 NOTE — Progress Notes (Signed)
Off keflex for a few days.  L foot pain some better.  Some improvement in the meantime.  Still with some residual sx on 01/17/16.  He had to stay off his feet in the meantime, some better, but not back at work yet.  No fevers, no chills, no drainage.  Still with some pain with walking.  Not as bad as initial presentation.   Meds, vitals, and allergies reviewed.   ROS: Per HPI unless specifically indicated in ROS section   nad ncat L foot with medial 1st nail/toe not infected, less puffy than prev.  Not ttp.  Does have medial callus formation overtop of medially ingrown 1st nail.  Leading edge still not ingrown.  I was able to lift the medially ingrown portion and remove with needle holders after cleaning the area with etoh.  This was tolerated with expected/tolerable discomfort and the patient saw the prev ingrown nail fragment.  The overlying callus on the medial toe was trimmed down w/o pain.

## 2016-01-25 NOTE — Patient Instructions (Signed)
Restart keflex. Soak your foot twice a day in warm salt water.  Trim the dead skin back as needed.  Reasonable to keep a bandaid on it for cushion.  Take care.  Glad to see you.

## 2016-03-22 ENCOUNTER — Ambulatory Visit (INDEPENDENT_AMBULATORY_CARE_PROVIDER_SITE_OTHER): Payer: Managed Care, Other (non HMO) | Admitting: Internal Medicine

## 2016-03-22 ENCOUNTER — Encounter: Payer: Self-pay | Admitting: Internal Medicine

## 2016-03-22 VITALS — BP 136/84 | HR 68 | Temp 97.9°F | Wt 230.0 lb

## 2016-03-22 DIAGNOSIS — H00014 Hordeolum externum left upper eyelid: Secondary | ICD-10-CM

## 2016-03-22 NOTE — Progress Notes (Signed)
Subjective:    Patient ID: Andrew Mcconnell, male    DOB: 08/16/1970, 45 y.o.   MRN: [id number]  HPI  Pt presents to the clinic today with c/o left eye swelling and pain. This started 2-3 days ago. He describes the pain as burning. He has noticed itching and matting of the left eye. He has tried allergy eye drops and reports it burned his eye. He denies recent runny nose, nasal congestion, sore throat or cough. He has not had sick contacts that he is aware of.   Review of Systems      Past Medical History:  Diagnosis Date  . Allergy   . Blood in stool   . Hemorrhoids, external   . History of chicken pox     Current Outpatient Prescriptions  Medication Sig Dispense Refill  . esomeprazole (NEXIUM) 20 MG capsule Take 20 mg by mouth daily.    Marland Kitchen ibuprofen (ADVIL,MOTRIN) 600 MG tablet Take 1 tablet (600 mg total) by mouth every 8 (eight) hours as needed (for pain.  with food.). 30 tablet 1  . Multiple Vitamin (MULTIVITAMIN) tablet Take 1 tablet by mouth daily.      . tadalafil (CIALIS) 5 MG tablet Take 1 tablet (5 mg total) by mouth daily. 30 tablet 12   No current facility-administered medications for this visit.     No Known Allergies  Family History  Problem Relation Age of Onset  . Cancer Mother     cervical  . Hypertension Other   . Diabetes Other   . Prostate cancer Neg Hx   . Colon cancer Neg Hx     Social History   Social History  . Marital status: Single    Spouse name: N/A  . Number of children: N/A  . Years of education: N/A   Occupational History  . Not on file.   Social History Main Topics  . Smoking status: Never Smoker  . Smokeless tobacco: Never Used  . Alcohol use 0.0 oz/week     Comment: occ  . Drug use: No  . Sexual activity: Not on file   Other Topics Concern  . Not on file   Social History Narrative   Education:  High school   [employer] fan   Coached youth league basketball   Prev wireless network install, now doing warehouse work    3  kids     Constitutional: Denies fever, malaise, fatigue, headache or abrupt weight changes.  HEENT: Pt reports left eye pain and swelling. Denies eye pain, ear pain, ringing in the ears, wax buildup, runny nose, nasal congestion, bloody nose, or sore throat. Respiratory: Denies difficulty breathing, shortness of breath, cough or sputum production.     No other specific complaints in a complete review of systems (except as listed in HPI above).  Objective:   Physical Exam    BP 136/84   Pulse 68   Temp 97.9 F (36.6 C) (Oral)   Wt 230 lb (104.3 kg)   SpO2 98%   BMI 29.53 kg/m  Wt Readings from Last 3 Encounters:  03/22/16 230 lb (104.3 kg)  01/25/16 223 lb (101.2 kg)  01/12/16 222 lb 12 oz (101 kg)    General: Appears his stated age, in NAD. HEENT: Head: normal shape and size; Eyes: sclera white, no icterus, conjunctiva pink. Internal hordeolum noted of left upper eyelid.  BMET    Component Value Date/Time   NA 138 06/09/2015 0951   K 3.9 06/09/2015 0951  CL 102 06/09/2015 0951   CO2 31 06/09/2015 0951   GLUCOSE 98 06/09/2015 0951   BUN 16 06/09/2015 0951   CREATININE 1.31 06/09/2015 0951   CALCIUM 9.8 06/09/2015 0951    Lipid Panel     Component Value Date/Time   CHOL 227 (H) 06/09/2015 0951   TRIG 141.0 06/09/2015 0951   HDL 55.40 06/09/2015 0951   CHOLHDL 4 06/09/2015 0951   VLDL 28.2 06/09/2015 0951   LDLCALC 143 (H) 06/09/2015 0951    CBC    Component Value Date/Time   WBC 5.2 04/15/2014 1133   RBC 5.17 04/15/2014 1133   HGB 17.0 04/15/2014 1133   HCT 51.5 04/15/2014 1133   PLT 257.0 04/15/2014 1133   MCV 99.6 04/15/2014 1133   MCHC 33.0 04/15/2014 1133   RDW 13.9 04/15/2014 1133   LYMPHSABS 1.8 04/15/2014 1133   MONOABS 0.5 04/15/2014 1133   EOSABS 0.0 04/15/2014 1133   BASOSABS 0.0 04/15/2014 1133    Hgb A1C No results found for: HGBA1C        Assessment & Plan:   Stye, left eye:  Advised him to try warm compresses  QID Ibuprofen as needed for swelling If persist> 2 weeks, make an appt with opthalmology to discuss I&D  RTC as needed or if symptoms persist or worsen Georgenia Salim, NP

## 2016-03-22 NOTE — Patient Instructions (Signed)

## 2016-05-28 IMAGING — CR DG CHEST 1V
1 series · 1 of 1 positions shown · non-contrast
Comparison: None.

CLINICAL DATA: 45-year-old male undergoing physical exam. Initial
encounter.

EXAM:
CHEST 1 VIEW

[view not recorded]
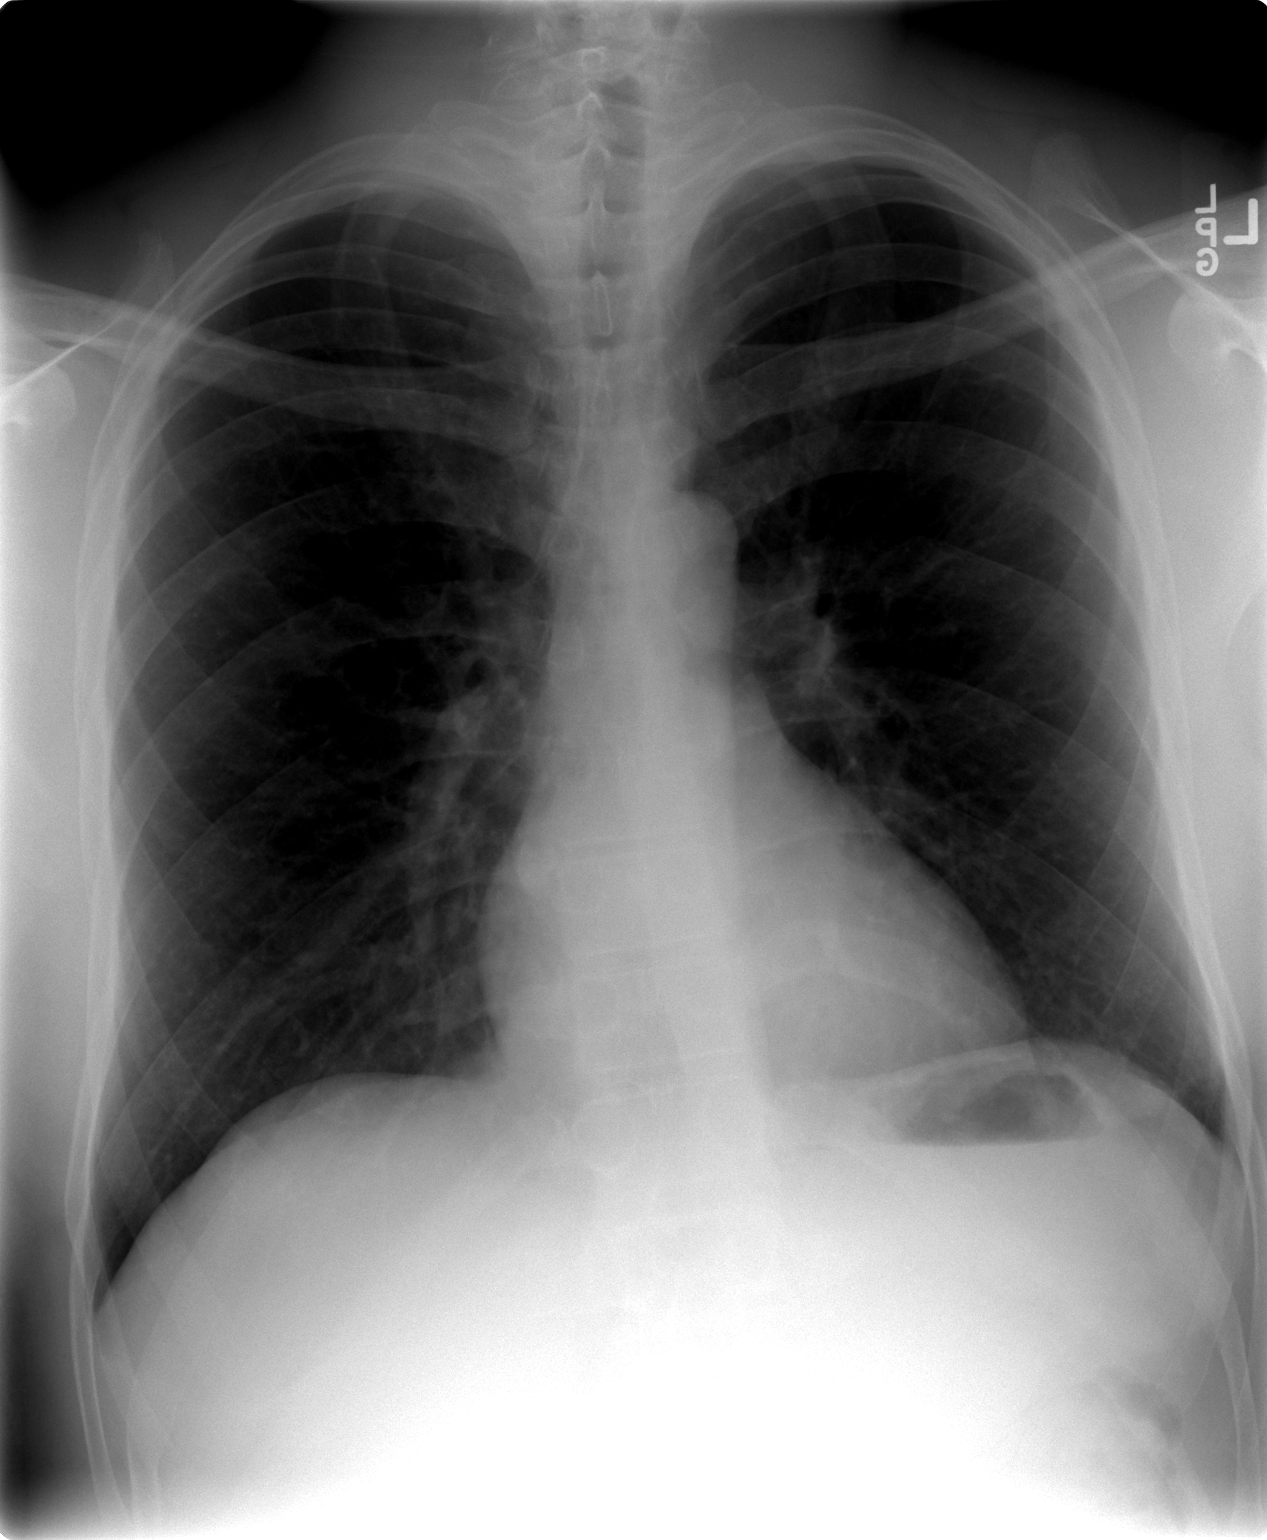

[1 of 1 positions shown; findings below may reference images not displayed]

FINDINGS: Normal lung volumes. Normal cardiac size and mediastinal contours.
Visualized tracheal air column is within normal limits. The lungs
are clear. No osseous abnormality identified. Negative visible bowel
gas pattern.
IMPRESSION: Negative, no acute cardiopulmonary abnormality.

## 2016-12-13 ENCOUNTER — Encounter: Payer: Self-pay | Admitting: Family Medicine

## 2016-12-13 ENCOUNTER — Ambulatory Visit (INDEPENDENT_AMBULATORY_CARE_PROVIDER_SITE_OTHER): Payer: BLUE CROSS/BLUE SHIELD | Admitting: Family Medicine

## 2016-12-13 DIAGNOSIS — R21 Rash and other nonspecific skin eruption: Secondary | ICD-10-CM | POA: Diagnosis not present

## 2016-12-13 MED ORDER — DEXAMETHASONE SODIUM PHOSPHATE 10 MG/ML IJ SOLN
10.0000 mg | Freq: Once | INTRAMUSCULAR | Status: AC
  Administered 2016-12-13: 10 mg via INTRAMUSCULAR

## 2016-12-13 MED ORDER — DOXYCYCLINE HYCLATE 100 MG PO TABS
100.0000 mg | ORAL_TABLET | Freq: Two times a day (BID) | ORAL | 0 refills | Status: DC
Start: 2016-12-13 — End: 2017-08-14

## 2016-12-13 NOTE — Patient Instructions (Signed)
Good to see you.  Okay to continue taking Benadryl at night, zyrtec or claritin in the morning for itching.  Doxycyline 100 mg twice daily for 7 days.   Please keep me updated.

## 2016-12-13 NOTE — Progress Notes (Signed)
Subjective:   Patient ID: Andrew Mcconnell, male    DOB: 1970-06-25, 46 y.o.   MRN: [id number]  Andrew Mcconnell is a pleasant 46 y.o. year old male patient of Dr. Para March, new to me, who presents to clinic today with Facial Swelling  on 12/13/2016  HPI:  Two days ago, used a facial dye for his beard.  Next day, very itchy, blistery rash. No draining thick, yellow pus. Remains itchy and painful.  No fever.  Never had anything like this before.  No CP, wheezing or SOB.   Current Outpatient Prescriptions on File Prior to Visit  Medication Sig Dispense Refill  . esomeprazole (NEXIUM) 20 MG capsule Take 20 mg by mouth daily.    Marland Kitchen ibuprofen (ADVIL,MOTRIN) 600 MG tablet Take 1 tablet (600 mg total) by mouth every 8 (eight) hours as needed (for pain.  with food.). 30 tablet 1  . Multiple Vitamin (MULTIVITAMIN) tablet Take 1 tablet by mouth daily.      . tadalafil (CIALIS) 5 MG tablet Take 1 tablet (5 mg total) by mouth daily. 30 tablet 12   No current facility-administered medications on file prior to visit.     No Known Allergies  Past Medical History:  Diagnosis Date  . Allergy   . Blood in stool   . Hemorrhoids, external   . History of chicken pox     No past surgical history on file.  Family History  Problem Relation Age of Onset  . Cancer Mother        cervical  . Hypertension Other   . Diabetes Other   . Prostate cancer Neg Hx   . Colon cancer Neg Hx     Social History   Social History  . Marital status: Single    Spouse name: N/A  . Number of children: N/A  . Years of education: N/A   Occupational History  . Not on file.   Social History Main Topics  . Smoking status: Never Smoker  . Smokeless tobacco: Never Used  . Alcohol use 0.0 oz/week     Comment: occ  . Drug use: No  . Sexual activity: Not on file   Other Topics Concern  . Not on file   Social History Narrative   Education:  High school   [employer] fan   Coached youth league basketball   Prev  wireless network install, now doing warehouse work    3 kids   The PMH, PSH, Social History, Family History, Medications, and allergies have been reviewed in [facility], and have been updated if relevant.  Review of Systems  Constitutional: Negative.   HENT: Negative for trouble swallowing.   Respiratory: Negative.   Cardiovascular: Negative.   Gastrointestinal: Negative.   Skin: Positive for rash.  All other systems reviewed and are negative.      Objective:    BP (!) 120/100   Pulse 63   Temp 98 F (36.7 C)   Ht 6\' 2"  (1.88 m)   Wt 227 lb (103 kg)   SpO2 99%   BMI 29.15 kg/m    Physical Exam  Constitutional: He is oriented to person, place, and time. He appears well-developed and well-nourished. No distress.  HENT:  Head: Normocephalic and atraumatic.  Eyes: Conjunctivae are normal.  Cardiovascular: Normal rate.   Pulmonary/Chest: Effort normal.  Musculoskeletal: Normal range of motion. He exhibits no edema.  Neurological: He is alert and oriented to person, place, and time.  Skin: He is not  diaphoretic.     Psychiatric: He has a normal mood and affect. His behavior is normal. Judgment and thought content normal.  Nursing note and vitals reviewed.         Assessment & Plan:   Rash No Follow-up on file.

## 2016-12-13 NOTE — Assessment & Plan Note (Signed)
New- allergic reaction now with signs of secondary infection. IM decadron in office to help with itching and inflammation. Doxycyline 100 mg twice daily x 7 days (eRx sent). Call or return to clinic prn if these symptoms worsen or fail to improve as anticipated. The patient indicates understanding of these issues and agrees with the plan.

## 2017-07-04 ENCOUNTER — Encounter: Payer: Self-pay | Admitting: Family Medicine

## 2017-08-14 ENCOUNTER — Encounter: Payer: Self-pay | Admitting: Family Medicine

## 2017-08-14 ENCOUNTER — Ambulatory Visit (INDEPENDENT_AMBULATORY_CARE_PROVIDER_SITE_OTHER): Payer: BLUE CROSS/BLUE SHIELD | Admitting: Family Medicine

## 2017-08-14 DIAGNOSIS — R030 Elevated blood-pressure reading, without diagnosis of hypertension: Secondary | ICD-10-CM | POA: Diagnosis not present

## 2017-08-14 NOTE — Patient Instructions (Signed)
Check your BP at home a few times.  Check it in the AM on a relaxed day after your urinated and rest for a few minutes.   Use an arm cuff and not a wrist cuff.  Goal BP <140/<90 Update me as needed.  Your labs are okay and you look good.  Your EKG is fine.  Take care.  Glad to see you.

## 2017-08-14 NOTE — Progress Notes (Signed)
BP check.  D/w pt.  He had BP elevation at work on [employer] checks.  He had sig stress with longer shifts and that could have contributed.  BP is normal here today.  No exertional CP, SOB, BLE edema.    L 1st toe sx that are "annoying", feels like it "needs to pop" but not painful.  Going on for months.    No FCNAVD.  He doesn't feel unwell.  No syncope.   EKG and outside labs discussed with patient.  EKG is unremarkable.  He has sinus bradycardia likely attributed to fitness. Based on current numbers, ASCVD score <5%.    Meds, vitals, and allergies reviewed.   ROS: Per HPI unless specifically indicated in ROS section   GEN: nad, alert and oriented HEENT: mucous membranes moist NECK: supple w/o LA CV: rrr PULM: ctab, no inc wob ABD: soft, +bs EXT: no edema L upper eyelid with stye- d/w pt about warm compresses.   L 1st MTP not red, not ttp.  Normal ROM at 1st MTP.   L arm 132/82 R arm 130/86.

## 2017-08-15 ENCOUNTER — Encounter: Payer: Self-pay | Admitting: Family Medicine

## 2017-08-15 DIAGNOSIS — R030 Elevated blood-pressure reading, without diagnosis of hypertension: Secondary | ICD-10-CM | POA: Insufficient documentation

## 2017-08-15 LAB — LAB REPORT - SCANNED
ALT: 20
AST: 19
CREATININE: 1.26
Cholesterol, Total: 213
Glucose: 109
HDL Cholesterol: 55
Hemoglobin: 16
LDL CHOLESTEROL (CALC): 136
TRIGLYCERIDES: 116

## 2017-08-15 NOTE — Assessment & Plan Note (Signed)
Based on current numbers, ASCVD score <5%.  No indication for statin or other aggressive management at this point.  Recheck blood pressure similar in both arms and at goal today. D/w pt to check BP at home a few times.  Check it in the AM on a relaxed day after your urinated and rest for a few minutes.   Use an arm cuff and not a wrist cuff.  Goal BP <140/<90 Update me as needed. He agrees.    We also talked about his left foot symptoms.  He is trying to use shoes/boots with enough cushion for comfort.  We can refer to podiatry later on if needed.  He can use a warm compress on the stye on his left upper eyelid.

## 2017-09-13 DIAGNOSIS — Z7251 High risk heterosexual behavior: Secondary | ICD-10-CM | POA: Diagnosis not present

## 2017-09-13 DIAGNOSIS — Z114 Encounter for screening for human immunodeficiency virus [HIV]: Secondary | ICD-10-CM | POA: Diagnosis not present

## 2018-11-25 DIAGNOSIS — Z1159 Encounter for screening for other viral diseases: Secondary | ICD-10-CM | POA: Diagnosis not present

## 2019-04-30 ENCOUNTER — Ambulatory Visit: Payer: BLUE CROSS/BLUE SHIELD | Admitting: Podiatry

## 2019-05-07 ENCOUNTER — Ambulatory Visit: Payer: BC Managed Care – PPO | Admitting: Podiatry

## 2019-05-07 ENCOUNTER — Encounter: Payer: Self-pay | Admitting: Podiatry

## 2019-05-07 ENCOUNTER — Ambulatory Visit (INDEPENDENT_AMBULATORY_CARE_PROVIDER_SITE_OTHER): Payer: BC Managed Care – PPO

## 2019-05-07 ENCOUNTER — Other Ambulatory Visit: Payer: Self-pay

## 2019-05-07 VITALS — BP 180/113 | HR 76

## 2019-05-07 DIAGNOSIS — M778 Other enthesopathies, not elsewhere classified: Secondary | ICD-10-CM | POA: Diagnosis not present

## 2019-05-07 DIAGNOSIS — M2012 Hallux valgus (acquired), left foot: Secondary | ICD-10-CM

## 2019-05-07 DIAGNOSIS — M205X2 Other deformities of toe(s) (acquired), left foot: Secondary | ICD-10-CM

## 2019-05-07 NOTE — Progress Notes (Signed)
Subjective:  Patient ID: Andrew Mcconnell, male    DOB: 10/09/1970,  MRN: [id number] HPI Chief Complaint  Patient presents with  . Bunions    left foot, great toe is painful and sore since 2 years and getting worse    48 y.o. male presents with the above complaint.   ROS: Denies fever chills nausea vomiting muscle aches pains calf pain back pain chest pain shortness of breath.  Past Medical History:  Diagnosis Date  . Allergy   . Blood in stool   . Hemorrhoids, external   . History of chicken pox    No past surgical history on file.  Current Outpatient Medications:  .  esomeprazole (NEXIUM) 20 MG capsule, Take 20 mg by mouth daily., Disp: , Rfl:  .  ibuprofen (ADVIL,MOTRIN) 600 MG tablet, Take 1 tablet (600 mg total) by mouth every 8 (eight) hours as needed (for pain.  with food.)., Disp: 30 tablet, Rfl: 1 .  Multiple Vitamin (MULTIVITAMIN) tablet, Take 1 tablet by mouth daily.  , Disp: , Rfl:  .  tadalafil (CIALIS) 5 MG tablet, Take 1 tablet (5 mg total) by mouth daily., Disp: 30 tablet, Rfl: 12  No Known Allergies Review of Systems Objective:   Vitals:   05/07/19 0939  BP: (!) 180/113  Pulse: 76    General: Well developed, nourished, in no acute distress, alert and oriented x3   Dermatological: Skin is warm, dry and supple bilateral. Nails x 10 are well maintained; remaining integument appears unremarkable at this time. There are no open sores, no preulcerative lesions, no rash or signs of infection present.  Vascular: Dorsalis Pedis artery and Posterior Tibial artery pedal pulses are 2/4 bilateral with immedate capillary fill time. Pedal hair growth present. No varicosities and no lower extremity edema present bilateral.   Neruologic: Grossly intact via light touch bilateral. Vibratory intact via tuning fork bilateral. Protective threshold with Semmes Wienstein monofilament intact to all pedal sites bilateral. Patellar and Achilles deep tendon reflexes 2+ bilateral. No  Babinski or clonus noted bilateral.   Musculoskeletal: No gross boney pedal deformities bilateral. No pain, crepitus, or limitation noted with foot and ankle range of motion bilateral. Muscular strength 5/5 in all groups tested bilateral.  Good range of motion only painful the top end of the range of motion and painful along the medial aspect of the first metatarsophalangeal joint.  Gait: Unassisted, Nonantalgic.    Radiographs:  Radiographs demonstrate a mild hallux abductovalgus deformity with joint space narrowing some subchondral sclerosis and some dorsal spurring.  All consistent with early osteoarthritic change or hallux limitus.  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  Plan: Discussed etiology pathology conservative surgical therapies at this point we discussed the need for surgical therapy consisting of an Vassie Moselle Zwick type osteotomy and at a later date a IT consultant.  At this point he would like to try an injection.  After sterile Betadine skin prep I injected 2 mg of dexamethasone and local anesthetic to the tight first metatarsophalangeal joint left.  I will follow-up with him in 1 month if necessary.     Kaylina Cahue T. [place], [place]

## 2019-05-09 ENCOUNTER — Ambulatory Visit: Payer: BC Managed Care – PPO | Admitting: Family Medicine

## 2019-05-09 ENCOUNTER — Other Ambulatory Visit: Payer: Self-pay

## 2019-05-09 ENCOUNTER — Encounter: Payer: Self-pay | Admitting: Family Medicine

## 2019-05-09 VITALS — BP 138/92 | HR 75 | Temp 97.4°F | Ht 74.0 in | Wt 235.2 lb

## 2019-05-09 DIAGNOSIS — R030 Elevated blood-pressure reading, without diagnosis of hypertension: Secondary | ICD-10-CM

## 2019-05-09 MED ORDER — TADALAFIL 5 MG PO TABS: 5.0000 mg | ORAL_TABLET | Freq: Every day | ORAL | 12 refills | Status: DC

## 2019-05-09 NOTE — Patient Instructions (Signed)
Go to the lab on the way out.  We'll contact you with your lab report. Cut back on salt, increase water, and check your BP at home in the AMs at rest.  Update me if persistently >140/>90. Take care.  Glad to see you.

## 2019-05-09 NOTE — Progress Notes (Signed)
This visit occurred during the SARS-CoV-2 public health emergency.  Safety protocols were in place, including screening questions prior to the visit, additional usage of staff PPE, and extensive cleaning of exam room while observing appropriate contact time as indicated for disinfecting solutions.  He had foot injection for foot pain, I'll defer.  He agrees.    Elevated BP noted by patient.  He had checked it at work.  He had higher BP at foot clinic.  Staff at the foot clinic thought he had DM but he has a FH, not a personal hx.  That likely caused some anxiety on his part.  Podiatry clinic reviewed.  He isn't diabetic based on prev labs.  D/w pt.    He had checked BP at home, 131/71, 131/93, 117/100.    No CP, SOB, BLE edema.    He needed refill on cialis, rx sent.    Meds, vitals, and allergies reviewed.   ROS: Per HPI unless specifically indicated in ROS section   GEN: nad, alert and oriented HEENT: ncat NECK: supple w/o LA CV: rrr PULM: ctab, no inc wob ABD: soft, +bs EXT: no edema SKIN: well perfused.

## 2019-05-10 LAB — BASIC METABOLIC PANEL
BUN: 18 mg/dL (ref 7–25)
CO2: 25 mmol/L (ref 20–32)
Calcium: 9.4 mg/dL (ref 8.6–10.3)
Chloride: 105 mmol/L (ref 98–110)
Creat: 1.29 mg/dL (ref 0.60–1.35)
Glucose, Bld: 85 mg/dL (ref 65–99)
Potassium: 4.3 mmol/L (ref 3.5–5.3)
Sodium: 140 mmol/L (ref 135–146)

## 2019-05-12 NOTE — Assessment & Plan Note (Signed)
Some of his blood pressure is elevated but not all are.  Discussed.  We do not have to start him on medication at this point.  Check routine labs today.  He will cut back on salt, increase water, and check his BP at home in the AMs at rest.  He will update me if persistently >140/>90. Still okay for outpatient follow-up.  He agrees with plan.

## 2019-05-28 ENCOUNTER — Ambulatory Visit: Payer: BC Managed Care – PPO | Admitting: Podiatry

## 2019-07-02 ENCOUNTER — Encounter: Payer: Self-pay | Admitting: Podiatry

## 2019-07-02 ENCOUNTER — Ambulatory Visit: Payer: BC Managed Care – PPO | Admitting: Podiatry

## 2019-07-02 ENCOUNTER — Other Ambulatory Visit: Payer: Self-pay

## 2019-07-02 DIAGNOSIS — M778 Other enthesopathies, not elsewhere classified: Secondary | ICD-10-CM

## 2019-07-02 NOTE — Progress Notes (Signed)
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  States that is still hurting some he states that it is not as bad as it was previously.  I know him going have to have surgery and I like to consider that sometime in July.  But if possible could we do another injection.  Objective: Vital signs are stable alert and oriented x3.  Pulses are palpable.  He still has tenderness on range of motion of the first metatarsophalangeal joint is not warm to the touch he does moderately tender on end range of motion.  Dorsal spurring is noted.  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  Plan: Discussed etiology pathology conservative surgical therapy at this point in time went ahead and injected with Kenalog this time 20 mg of Kenalog 5 mg Marcaine point maximal tenderness after sterile Betadine skin prep.  He tolerated procedure well without complications I will follow-up with him in May for a consult for July.

## 2019-09-10 ENCOUNTER — Ambulatory Visit: Payer: BC Managed Care – PPO | Admitting: Podiatry

## 2020-09-18 ENCOUNTER — Ambulatory Visit: Payer: BC Managed Care – PPO | Admitting: Family Medicine

## 2020-09-18 ENCOUNTER — Other Ambulatory Visit: Payer: Self-pay

## 2020-09-18 ENCOUNTER — Encounter: Payer: Self-pay | Admitting: Family Medicine

## 2020-09-18 VITALS — BP 122/70 | HR 66 | Temp 97.8°F | Wt 218.6 lb

## 2020-09-18 DIAGNOSIS — R519 Headache, unspecified: Secondary | ICD-10-CM | POA: Diagnosis not present

## 2020-09-18 LAB — SEDIMENTATION RATE: Sed Rate: 12 mm/hr (ref 0–20)

## 2020-09-18 LAB — CBC WITH DIFFERENTIAL/PLATELET
Basophils Absolute: 0 10*3/uL (ref 0.0–0.1)
Basophils Relative: 0.3 % (ref 0.0–3.0)
Eosinophils Absolute: 0 10*3/uL (ref 0.0–0.7)
Eosinophils Relative: 1 % (ref 0.0–5.0)
HCT: 47.8 % (ref 39.0–52.0)
Hemoglobin: 16.4 g/dL (ref 13.0–17.0)
Lymphocytes Relative: 37.1 % (ref 12.0–46.0)
Lymphs Abs: 1.8 10*3/uL (ref 0.7–4.0)
MCHC: 34.3 g/dL (ref 30.0–36.0)
MCV: 99.4 fl (ref 78.0–100.0)
Monocytes Absolute: 0.6 10*3/uL (ref 0.1–1.0)
Monocytes Relative: 11.7 % (ref 3.0–12.0)
Neutro Abs: 2.4 10*3/uL (ref 1.4–7.7)
Neutrophils Relative %: 49.9 % (ref 43.0–77.0)
Platelets: 272 10*3/uL (ref 150.0–400.0)
RBC: 4.81 Mil/uL (ref 4.22–5.81)
RDW: 13.3 % (ref 11.5–15.5)
WBC: 4.7 10*3/uL (ref 4.0–10.5)

## 2020-09-18 LAB — BASIC METABOLIC PANEL
BUN: 19 mg/dL (ref 6–23)
CO2: 27 mEq/L (ref 19–32)
Calcium: 9.8 mg/dL (ref 8.4–10.5)
Chloride: 102 mEq/L (ref 96–112)
Creatinine, Ser: 1.26 mg/dL (ref 0.40–1.50)
GFR: 66.66 mL/min (ref >60.00)
Glucose, Bld: 99 mg/dL (ref 70–99)
Potassium: 4.2 mEq/L (ref 3.5–5.1)
Sodium: 140 mEq/L (ref 135–145)

## 2020-09-18 NOTE — Patient Instructions (Signed)
Go to the lab on the way out.   If you have mychart we'll likely use that to update you.    Schedule a physical in the near future.  Take care.  Glad to see you.

## 2020-09-18 NOTE — Progress Notes (Addendum)
This visit occurred during the SARS-CoV-2 public health emergency.  Safety protocols were in place, including screening questions prior to the visit, additional usage of staff PPE, and extensive cleaning of exam room while observing appropriate contact time as indicated for disinfecting solutions.  BP elevation d/w pt, elevated on mult home checks.  Up to 170 systolic at home.  He was having headaches at the time.  L sided HA prev, usually in the later afternoon.  He had some L eye watering and L eyelid was slightly droopy at the time but not shut. Eyelid changes have happened mult times over the last few weeks but not now, always on L and never on R. No double vision.  Vomited once, one episode w/o syncope but was sweaty at the time.  This was ~2 weeks ago.  No weakness or numbness. He was at work at the time, was able to keep working.  He had noted occ twitch on the L side of the face on the L cheek- happened one night but not since.    He doesn't typically have h/o HA like this.  No h/o migraines, no known FH.  Normal hearing.  No FCNAVD, no rash o/w.  R handed, normal handwriting.  BP at home a few days ago was lower than BP checks at work.    Meds, vitals, and allergies reviewed.   ROS: Per HPI unless specifically indicated in ROS section   GEN: nad, alert and oriented HEENT: ncat, PERRL, EOMI, TM WNL B, temporal areas not tender bilaterally. NECK: supple w/o LA CV: rrr.  no murmur PULM: ctab, no inc wob ABD: soft, +bs EXT: no edema SKIN: no acute rash CN 2-12 wnl B, S/S/DTR wnl x4  I would not start blood pressure medication at this point since his blood pressure is controlled at the office visit.  Discussed.  39 minutes were devoted to patient care in this encounter (this includes time spent reviewing the patient's file/history, interviewing and examining the patient, counseling/reviewing plan with patient).

## 2020-09-20 DIAGNOSIS — R519 Headache, unspecified: Secondary | ICD-10-CM | POA: Insufficient documentation

## 2020-09-20 NOTE — Addendum Note (Signed)
Addended by: Joaquim Nam on: 09/20/2020 10:28 PM   Modules accepted: Orders

## 2020-09-20 NOTE — Assessment & Plan Note (Signed)
Unclear source.  Reasonable to check basic labs today to exclude temporal arteritis though this does not sound typical.  He does not have vision changes but he did have one episode with twitching in the left cheek and he also had episodes with eyelid droop.  This was always on the left side, never on the right.  No symptoms now a normal neurological exam at this point.  Funduscopic exam is limited since he is not dilated.  I want to consider options.  See notes on labs.

## 2020-09-21 ENCOUNTER — Encounter: Payer: Self-pay | Admitting: Neurology

## 2020-09-24 NOTE — Progress Notes (Signed)
   Discussed with patient at office visit on 09/18/2020. He was concerned about dysuria with potential exposure to STD. He inquired about testing and treatment. He could get tested here or he could get tested at the health department. Encouraged testing. We talked about multiple options for testing. Information provided to patient. He did not have dysuria currently but did once previously. We talked about options. Prescription given to patient for azithromycin 500 mg 2 tabs p.o. x1 dose, dispense 2 with no refills. Prescription given for cefixime 400 mg 2 tabs by mouth x1 dose, dispense 2 with no refills. Routine cautions given to patient. He will update me as needed.

## 2020-10-06 ENCOUNTER — Ambulatory Visit (INDEPENDENT_AMBULATORY_CARE_PROVIDER_SITE_OTHER)
Admission: RE | Admit: 2020-10-06 | Discharge: 2020-10-06 | Disposition: A | Discharge status: Home / Self Care | Payer: BC Managed Care – PPO | Source: Ambulatory Visit | Attending: Family Medicine | Admitting: Family Medicine

## 2020-10-06 ENCOUNTER — Other Ambulatory Visit: Payer: Self-pay

## 2020-10-06 DIAGNOSIS — R519 Headache, unspecified: Secondary | ICD-10-CM | POA: Diagnosis not present

## 2020-10-07 ENCOUNTER — Other Ambulatory Visit: Payer: Self-pay | Admitting: Family Medicine

## 2020-10-07 DIAGNOSIS — Z125 Encounter for screening for malignant neoplasm of prostate: Secondary | ICD-10-CM

## 2020-10-07 DIAGNOSIS — Z113 Encounter for screening for infections with a predominantly sexual mode of transmission: Secondary | ICD-10-CM

## 2020-10-07 DIAGNOSIS — E785 Hyperlipidemia, unspecified: Secondary | ICD-10-CM

## 2020-10-09 ENCOUNTER — Other Ambulatory Visit (INDEPENDENT_AMBULATORY_CARE_PROVIDER_SITE_OTHER): Payer: BC Managed Care – PPO

## 2020-10-09 ENCOUNTER — Other Ambulatory Visit: Payer: Self-pay

## 2020-10-09 DIAGNOSIS — E785 Hyperlipidemia, unspecified: Secondary | ICD-10-CM

## 2020-10-09 DIAGNOSIS — Z125 Encounter for screening for malignant neoplasm of prostate: Secondary | ICD-10-CM | POA: Diagnosis not present

## 2020-10-09 DIAGNOSIS — Z113 Encounter for screening for infections with a predominantly sexual mode of transmission: Secondary | ICD-10-CM | POA: Diagnosis not present

## 2020-10-09 LAB — PSA: PSA: 2.95 ng/mL (ref 0.10–4.00)

## 2020-10-12 LAB — HEPATIC FUNCTION PANEL
ALT: 26 U/L (ref 0–53)
AST: 26 U/L (ref 0–37)
Albumin: 4.3 g/dL (ref 3.5–5.2)
Alkaline Phosphatase: 71 U/L (ref 39–117)
Bilirubin, Direct: 0.2 mg/dL (ref 0.0–0.3)
Total Bilirubin: 1.3 mg/dL — ABNORMAL HIGH (ref 0.2–1.2)
Total Protein: 8.1 g/dL (ref 6.0–8.3)

## 2020-10-12 LAB — HIV ANTIBODY (ROUTINE TESTING W REFLEX): HIV 1&2 Ab, 4th Generation: NONREACTIVE

## 2020-10-12 LAB — LIPID PANEL
Cholesterol: 259 mg/dL — ABNORMAL HIGH (ref 0–200)
HDL: 53 mg/dL (ref >39.00)
NonHDL: 205.77
Total CHOL/HDL Ratio: 5
Triglycerides: 217 mg/dL — ABNORMAL HIGH (ref 0.0–149.0)
VLDL: 43.4 mg/dL — ABNORMAL HIGH (ref 0.0–40.0)

## 2020-10-12 LAB — LDL CHOLESTEROL, DIRECT: Direct LDL: 143 mg/dL

## 2020-10-12 LAB — C. TRACHOMATIS/N. GONORRHOEAE RNA
C. trachomatis RNA, TMA: NOT DETECTED
N. gonorrhoeae RNA, TMA: NOT DETECTED

## 2020-10-12 LAB — HEPATITIS C ANTIBODY
Hepatitis C Ab: NONREACTIVE
SIGNAL TO CUT-OFF: 0.44 (ref <1.00)

## 2020-10-12 LAB — RPR: RPR Ser Ql: NONREACTIVE

## 2020-10-16 ENCOUNTER — Encounter: Payer: Self-pay | Admitting: Family Medicine

## 2020-10-16 ENCOUNTER — Ambulatory Visit (INDEPENDENT_AMBULATORY_CARE_PROVIDER_SITE_OTHER): Payer: BC Managed Care – PPO | Admitting: Family Medicine

## 2020-10-16 ENCOUNTER — Other Ambulatory Visit: Payer: Self-pay

## 2020-10-16 VITALS — BP 142/84 | HR 77 | Temp 96.1°F | Ht 74.0 in | Wt 222.0 lb

## 2020-10-16 DIAGNOSIS — Z1211 Encounter for screening for malignant neoplasm of colon: Secondary | ICD-10-CM | POA: Diagnosis not present

## 2020-10-16 DIAGNOSIS — Z0000 Encounter for general adult medical examination without abnormal findings: Secondary | ICD-10-CM | POA: Diagnosis not present

## 2020-10-16 DIAGNOSIS — Z7189 Other specified counseling: Secondary | ICD-10-CM

## 2020-10-16 DIAGNOSIS — E785 Hyperlipidemia, unspecified: Secondary | ICD-10-CM

## 2020-10-16 DIAGNOSIS — R519 Headache, unspecified: Secondary | ICD-10-CM

## 2020-10-16 NOTE — Patient Instructions (Signed)
Keep working on diet and exercise and we'll recheck your labs next physical.  Keep the appointment with neurology.  We'll call about cologuard.  Take care.  Glad to see you.  If your BP stays above 140/90 at home then let me know.

## 2020-10-16 NOTE — Progress Notes (Signed)
This visit occurred during the SARS-CoV-2 public health emergency.  Safety protocols were in place, including screening questions prior to the visit, additional usage of staff PPE, and extensive cleaning of exam room while observing appropriate contact time as indicated for disinfecting solutions.  CPE- See plan.  Routine anticipatory guidance given to patient.  See health maintenance.  The possibility exists that previously documented standard health maintenance information may have been brought forward from a previous encounter into this note.  If needed, that same information has been updated to reflect the current situation based on today's encounter.    Tetanus 2012 PNA not due.  Shingles d/w pt Flu shot encouraged.    Covid vaccine prev done PSA wnl.  No FH.   D/w patient [id number] for colon cancer screening, including IFOB vs. colonoscopy.  Risks and benefits of both were discussed and patient voiced understanding.  Pt elects for cologuard.   Diet and exercise discussed.  Living will d/w pt.  Thomes Dinning would be designated if patient were incapacitated.  Labs d/w pt.    Lipids d/w pt.  He'll keep working on diet and exercise.  ASCVD score discussed with patient.  He hasn't had neuro sx like prev eyelid droop.  CT w/o sig changes.  Neuro f/u pending.  No sig HA in the meantime, only occ mild HA in the interval and that resolved.  No other focal neurologic symptoms in the interval.  PMH and SH reviewed  Meds, vitals, and allergies reviewed.   ROS: Per HPI.  Unless specifically indicated otherwise in HPI, the patient denies:  General: fever. Eyes: acute vision changes ENT: sore throat Cardiovascular: chest pain Respiratory: SOB GI: vomiting GU: dysuria Musculoskeletal: acute back pain Derm: acute rash Neuro: acute motor dysfunction Psych: worsening mood Endocrine: polydipsia Heme: bleeding Allergy: hayfever  GEN: nad, alert and oriented HEENT: ncat NECK: supple w/o  LA CV: rrr. PULM: ctab, no inc wob ABD: soft, +bs EXT: no edema SKIN: no acute rash  The 10-year ASCVD risk score Denman George DC Jr., et al., 2013) is: 6.8%   Values used to calculate the score:     Age: 50 years     Sex: Male     Is Non-Hispanic African American: Yes     Diabetic: No     Tobacco smoker: No     Systolic Blood Pressure: 142 mmHg     Is BP treated: No     HDL Cholesterol: 53 mg/dL     Total Cholesterol: 259 mg/dL

## 2020-10-18 DIAGNOSIS — E785 Hyperlipidemia, unspecified: Secondary | ICD-10-CM | POA: Insufficient documentation

## 2020-10-18 NOTE — Assessment & Plan Note (Signed)
No new symptoms.  Previous imaging discussed with patient.  I will await neurology input.

## 2020-10-18 NOTE — Assessment & Plan Note (Signed)
Living will d/w pt.  Andrew Mcconnell would be designated if patient were incapacitated.

## 2020-10-18 NOTE — Assessment & Plan Note (Signed)
Tetanus 2012 PNA not due.  Shingles d/w pt Flu shot encouraged.    Covid vaccine prev done PSA wnl.  No FH.   D/w patient [id number] for colon cancer screening, including IFOB vs. colonoscopy.  Risks and benefits of both were discussed and patient voiced understanding.  Pt elects for cologuard.   Diet and exercise discussed.  Living will d/w pt.  Thomes Dinning would be designated if patient were incapacitated.  Labs d/w pt.

## 2020-10-18 NOTE — Assessment & Plan Note (Signed)
  Lipids d/w pt.  He'll keep working on diet and exercise.  ASCVD score discussed with patient.  We can recheck periodically, at a physical.

## 2020-10-21 NOTE — Addendum Note (Signed)
Addended by: Wendie Simmer B on: 10/21/2020 09:59 AM   Modules accepted: Orders

## 2020-11-25 NOTE — Progress Notes (Signed)
NEUROLOGY CONSULTATION NOTE  Andrew Mcconnell MRN: [id number] DOB: 05/08/1971  Referring provider: Joaquim Nam, MD Primary care provider: Joaquim Nam, MD  Reason for consult:  headaches  Assessment/Plan:   Cluster headache  As he seems to have finished cluster, I wouldn't start a daily preventative Will prescribe sumatriptan 100mg  for abortive therapy If he should have a recurrence,would prescribe prednisone taper.  If they become more chronic,would need to start verapamil. Follow up 6 months.   Subjective:  Andrew Mcconnell is a 50 year old male who presents for headaches.  History supplemented by referring provider's notes.  Onset:  May 2022 for 2-3 week period.  No prior history Location:  Left sided  Quality:  pressure Intensity:  8/10.   Aura:  absent Prodrome:  absent Associated symptoms:  Left eye lacrimation and ptosis, left sided facial twitching, diaphoresis, one time with nausea.  He denies associated unilateral numbness or weakness. Duration:  several hours Frequency:  For 3 weeks, occurred daily.   Triggers:  absent Relieving factors:  maybe sinus medication Activity:  movement aggravated it but able to push through it. Headaches ended after 3 weeks. He may have a mild headache once in awhile, but nothing severe. No history of similar headaches in the past.  Sed rate on 09/18/2020 was 12.  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  Current NSAIDS/analgesics:  ibuprofen Current triptans:  none Current ergotamine:  none Current anti-emetic:  none Current muscle relaxants:  none Current Antihypertensive medications:  none Current Antidepressant medications:  none Current Anticonvulsant medications:  none Current anti-CGRP:  none Current Vitamins/Herbal/Supplements:  MVI Current Antihistamines/Decongestants:  none Other therapy:  none Hormone/birth  control:  none  Past NSAIDS/analgesics:  none Past abortive triptans:  none Past abortive ergotamine:  none Past muscle relaxants:  Flexeril Past anti-emetic:  none Past antihypertensive medications:  none Past antidepressant medications:  none Past anticonvulsant medications:  none Past anti-CGRP:  none Past vitamins/Herbal/Supplements:  none Past antihistamines/decongestants:  none Other past therapies:  none  Caffeine:  No coffee.  Soda once in [place].  Alcohol:  Yes.  Did not see correlation with headache Smoker:  no Diet:  32 oz water daily.  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        PAST MEDICAL HISTORY: Past Medical History:  Diagnosis Date   Allergy    Blood in stool    Hemorrhoids, external    History of chicken pox     PAST SURGICAL HISTORY: No past surgical history on file.  MEDICATIONS: Current Outpatient Medications on File Prior to Visit  Medication Sig Dispense Refill   esomeprazole (NEXIUM) 20 MG capsule Take 20 mg by mouth daily.     ibuprofen (ADVIL,MOTRIN) 600 MG tablet Take 1 tablet (600 mg total) by mouth every 8 (eight) hours as needed (for pain.  with food.). 30 tablet 1   Multiple Vitamin (MULTIVITAMIN) tablet Take 1 tablet by mouth daily.     tadalafil (CIALIS) 5 MG tablet Take 1 tablet (5 mg total) by mouth daily. 30 tablet 12   No current facility-administered medications on file prior to visit.    ALLERGIES: No Known Allergies  FAMILY HISTORY: Family History  Problem Relation Age of Onset   Cancer Mother  cervical   Hypertension Other    Diabetes Other    Prostate cancer Neg Hx    Colon cancer Neg Hx     Objective:  Blood pressure 140/82, pulse 72, height 6\' 2"  (1.88 m), weight 223 lb 2 oz (101.2 kg), SpO2 98 %. General: No acute distress.  Patient appears well-groomed.   Head:   Normocephalic/atraumatic Eyes:  fundi examined but not visualized Neck: supple, no paraspinal tenderness, full range of motion Back: No paraspinal tenderness Heart: regular rate and rhythm Lungs: Clear to auscultation bilaterally. Vascular: No carotid bruits. Neurological Exam: Mental status: alert and oriented to person, place, and time, recent and remote memory intact, fund of knowledge intact, attention and concentration intact, speech fluent and not dysarthric, language intact. Cranial nerves: CN I: not tested CN II: pupils equal, round and reactive to light, visual fields intact CN III, IV, VI:  full range of motion, no nystagmus, no ptosis CN V: facial sensation intact. CN VII: upper and lower face symmetric CN VIII: hearing intact CN IX, X: gag intact, uvula midline CN XI: sternocleidomastoid and trapezius muscles intact CN XII: tongue midline Bulk & Tone: normal, no fasciculations. Motor:  muscle strength 5/5 throughout Sensation:  Pinprick, temperature and vibratory sensation intact. Deep Tendon Reflexes:  2+ throughout,  toes downgoing.   Finger to nose testing:  Without dysmetria.   Heel to shin:  Without dysmetria.   Gait:  Normal station and stride.  Romberg negative.    Thank you for allowing me to take part in the care of this patient.  Shon Millet, DO  CC: Joaquim Nam, MD

## 2020-11-27 ENCOUNTER — Encounter: Payer: Self-pay | Admitting: Neurology

## 2020-11-27 ENCOUNTER — Ambulatory Visit: Payer: BC Managed Care – PPO | Admitting: Neurology

## 2020-11-27 ENCOUNTER — Other Ambulatory Visit: Payer: Self-pay

## 2020-11-27 VITALS — BP 140/82 | HR 72 | Ht 74.0 in | Wt 223.1 lb

## 2020-11-27 DIAGNOSIS — G44019 Episodic cluster headache, not intractable: Secondary | ICD-10-CM

## 2020-11-27 MED ORDER — SUMATRIPTAN SUCCINATE 100 MG PO TABS: ORAL_TABLET | ORAL | 5 refills | Status: DC

## 2020-11-27 NOTE — Patient Instructions (Signed)
Take sumatriptan 100mg  at earliest onset of headache.  May repeat dose once in 2 hours if needed.  Maximum 2 tablets in 24 hours. If you start experiencing recurrence of these headaches, I can give you a steroid taper to break/suppress it until the headaches run its course.  If headaches continue to become more frequent, we would need to start a daily medication. Follow up 6 months  Cluster Headache Cluster headaches hurt a lot. They normally happen on one side of your head, but they may switch sides. Often, cluster headaches: Cause a lot of pain. Happen for weeks to months. Last from 15 minutes to 3 hours. Happen at the same time each day. Happen at night. Happen many times a day. Happen more often in the fall and springtime. What are the causes? The exact cause is not known. They are not usually caused by foods, changes in body chemicals (hormonalchanges), or stress. What increases the risk? Being a male between the ages of 50-51 years old. Smoking or using products that contain nicotine or tobacco. Having elevated levels of body chemical called histamine. This can happen in people who have allergies. Taking certain medicines that cause blood vessels to expand. Having a parent or brother or sister who has cluster headaches. What are the signs or symptoms? Very bad pain on one side of the head that begins behind or around your eye but may spread to your face, head, and neck. Feeling like you may vomit (nauseous). Being sensitive to light. Runny nose and stuffy nose. Swelling of the forehead or face on the affected side. Eye problems. This might include a droopy or swollen eyelid, eye redness, or tearing on the affected side. Feeling restless or upset. Pale skin or a flushed face. How is this treated? Medicines. Oxygen that is breathed in through a mask. Follow these instructions at home: Headache diary Keep a headache diary as told by your doctor. Doing this can help you and  your doctor figure out what triggers your headaches. In your headache diary, include information about: The time of day that your headache started and what you were doing when it began. How long your headache lasted. Where your pain started and whether it moved to other areas. The type of pain. Your level of pain. Use a pain scale and rate the pain with a number from 1 (mild) up to 10 (very bad). The treatment that you used, and any change in symptoms after treatment.  Medicines Take over-the-counter and prescription medicines only as told by your health care provider. Ask your doctor if the medicine prescribed to you: Requires you to avoid driving or using machinery. Can cause trouble pooping (constipation). You may need to take these actions to prevent or treat trouble pooping: Drink enough fluid to keep your pee (urine) pale yellow. Take over-the-counter or prescription medicines. Eat foods that are high in fiber. These include beans, whole grains, and fresh fruits and vegetables. Limit foods that are high in fat and processed sugars. These include fried or sweet foods. Lifestyle  Go to bed at the same time each night. Get the same amount of sleep every night. Get 7-9 hours of sleep each night, or the amount recommended by your doctor. Limit or manage stress. Exercise regularly. Exercise for at least 30 minutes, 5 times each week. Eat a healthy diet. Avoid any foods that you know may trigger your headaches. Do not drink alcohol. Do not use any products that contain nicotine or tobacco, such as  cigarettes, e-cigarettes, and chewing tobacco. If you need help quitting, ask your doctor.  General instructions Use oxygen as told by your doctor. Keep all follow-up visits as told by your health care provider. This is important. Contact a doctor if: Your headaches: Change. Get worse. Happen more often. Your medicines or oxygen are not helping. Get help right away if: You faint. You get  weak or lose feeling (have numbness) on one side of your body or face. You see two of everything (double vision). You feel you may vomit or you vomit and it does stop after many hours. You have trouble with your balance or with walking. You have trouble talking. You have neck pain or stiffness and you have a fever. Summary Cluster headaches hurt a lot. Keep a headache diary. Do not drink alcohol. Medicines and oxygen may help you feel better. This information is not intended to replace advice given to you by your health care provider. Make sure you discuss any questions you have with your healthcare provider. Document Revised: 12/27/2019 Document Reviewed: 05/30/2019 Elsevier Patient Education  2022 [employer].

## 2020-12-01 DIAGNOSIS — Z1211 Encounter for screening for malignant neoplasm of colon: Secondary | ICD-10-CM | POA: Diagnosis not present

## 2020-12-01 LAB — COLOGUARD: Cologuard: NEGATIVE

## 2021-01-09 DIAGNOSIS — S46211A Strain of muscle, fascia and tendon of other parts of biceps, right arm, initial encounter: Secondary | ICD-10-CM | POA: Diagnosis not present

## 2021-01-20 DIAGNOSIS — S46211A Strain of muscle, fascia and tendon of other parts of biceps, right arm, initial encounter: Secondary | ICD-10-CM | POA: Diagnosis not present

## 2021-06-01 NOTE — Progress Notes (Deleted)
NEUROLOGY FOLLOW UP OFFICE NOTE  Andrew Mcconnell [id number]  Assessment/Plan:   Cluster headache   As he seems to have finished cluster, I wouldn't start a daily preventative Will prescribe sumatriptan 100mg  for abortive therapy If he should have a recurrence,would prescribe prednisone taper.  If they become more chronic,would need to start verapamil. Follow up 6 months.     Subjective:  Andrew Mcconnell is a 51 year old male who follows up for cluster headache.   UPDATE: Intensity:  *** Duration:  *** Frequency:  *** Frequency of abortive medication: *** Current NSAIDS/analgesics:  ibuprofen Current triptans:  sumatriptan 100mg  Current ergotamine:  none Current anti-emetic:  none Current muscle relaxants:  none Current Antihypertensive medications:  none Current Antidepressant medications:  none Current Anticonvulsant medications:  none Current anti-CGRP:  none Current Vitamins/Herbal/Supplements:  MVI Current Antihistamines/Decongestants:  none Other therapy:  none Hormone/birth control:  none  Caffeine:  No coffee.  Soda once in [place].  Alcohol:  Yes.  Did not see correlation with headache Smoker:  no Diet:  32 oz water daily.  Skips meals Exercise:  not routine Depression:  no; Anxiety:  no Other pain:  no Sleep hygiene:  usually okay  HISTORY:  Onset:  May 2022 for 2-3 week period.  No prior history Location:  Left sided  Quality:  pressure Initial Intensity:  8/10.   Aura:  absent Prodrome:  absent Associated symptoms:  Left eye lacrimation and ptosis, left sided facial twitching, diaphoresis, one time with nausea.  He denies associated unilateral numbness or weakness. Initial Duration:  several hours Initial Frequency:  For 3 weeks, occurred daily.   Triggers:  absent Relieving factors:  maybe sinus medication Activity:  movement aggravated it but able to push through it. Headaches ended after 3 weeks. He may have a mild headache once in awhile,  but nothing severe. No history of similar headaches in the past.   Sed rate on 09/18/2020 was 12.   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    PAST MEDICAL HISTORY: Past Medical History:  Diagnosis Date   Allergy    Blood in stool    Hemorrhoids, external    History of chicken pox     MEDICATIONS: Current Outpatient Medications on File Prior to Visit  Medication Sig Dispense Refill   esomeprazole (NEXIUM) 20 MG capsule Take 20 mg by mouth daily.     ibuprofen (ADVIL,MOTRIN) 600 MG tablet Take 1 tablet (600 mg total) by mouth every 8 (eight) hours as needed (for pain.  with food.). 30 tablet 1   Multiple Vitamin (MULTIVITAMIN) tablet Take 1 tablet by mouth daily.     SUMAtriptan (IMITREX) 100 MG tablet Take 1 tablet earliest onset of headache.  May repeat in 2 hours if headache persists or recurs.  Maximum 2 tablets in 24 hours. 10 tablet 5   tadalafil (CIALIS) 5 MG tablet Take 1 tablet (5 mg total) by mouth daily. 30 tablet 12   No current facility-administered medications on  file prior to visit.    ALLERGIES: No Known Allergies  FAMILY HISTORY: Family History  Problem Relation Age of Onset   Cancer Mother        cervical   Hypertension Other    Diabetes Other    Prostate cancer Neg Hx    Colon cancer Neg Hx       Objective:  *** General: No acute distress.  Patient appears ***-groomed.   Head:   Normocephalic/atraumatic Eyes:  Fundi examined but not visualized Neck: supple, no paraspinal tenderness, full range of motion Heart:  Regular rate and rhythm Lungs:  Clear to auscultation bilaterally Back: No paraspinal tenderness Neurological Exam: alert and oriented to person, place, and time.  Speech fluent and not dysarthric, language intact.  CN II-XII intact. Bulk and tone normal, muscle strength 5/5 throughout.  Sensation to light touch intact.  Deep tendon reflexes 2+ throughout, toes downgoing.  Finger to nose testing intact.  Gait normal, Romberg negative.   Shon Millet, DO  CC: ***

## 2021-06-02 ENCOUNTER — Encounter: Payer: Self-pay | Admitting: Neurology

## 2021-06-02 ENCOUNTER — Ambulatory Visit: Payer: BC Managed Care – PPO | Admitting: Neurology

## 2021-06-02 DIAGNOSIS — Z029 Encounter for administrative examinations, unspecified: Secondary | ICD-10-CM

## 2021-08-11 ENCOUNTER — Encounter: Payer: Self-pay | Admitting: Family Medicine

## 2021-08-11 ENCOUNTER — Ambulatory Visit (INDEPENDENT_AMBULATORY_CARE_PROVIDER_SITE_OTHER): Payer: BC Managed Care – PPO | Admitting: Family Medicine

## 2021-08-11 VITALS — BP 144/100 | HR 73 | Temp 97.9°F | Ht 74.0 in | Wt 215.1 lb

## 2021-08-11 DIAGNOSIS — M7502 Adhesive capsulitis of left shoulder: Secondary | ICD-10-CM | POA: Diagnosis not present

## 2021-08-11 DIAGNOSIS — M7542 Impingement syndrome of left shoulder: Secondary | ICD-10-CM | POA: Diagnosis not present

## 2021-08-11 MED ORDER — TRIAMCINOLONE ACETONIDE 40 MG/ML IJ SUSP
40.0000 mg | Freq: Once | INTRAMUSCULAR | Status: AC
  Administered 2021-08-11: 40 mg via INTRA_ARTICULAR

## 2021-08-11 NOTE — Progress Notes (Signed)
? ? ?Andrew Mcconnell T. Bailey Kolbe, MD, CAQ Sports Medicine ?Nature conservation officer at [facility] ?[address] [place] ?[name], [address] ? ?Phone: [phone]  FAX: [phone] ? ?Andrew Mcconnell - 51 y.o. male  MRN [id number]  Date of Birth: 1970-08-25 ? ?Date: 08/11/2021  PCP: Andrew Nam, MD  Referral: Andrew Nam, MD ? ?Chief Complaint  ?Patient presents with  ? Shoulder Pain  ?  Left  ? ? ?This visit occurred during the SARS-CoV-2 public health emergency.  Safety protocols were in place, including screening questions prior to the visit, additional usage of staff PPE, and extensive cleaning of exam room while observing appropriate contact time as indicated for disinfecting solutions.  ? ?Subjective:  ? ?Andrew Mcconnell is a 51 y.o. very pleasant male patient with Body mass index is 27.62 kg/m?. who presents with the following: ? ?Happened a couple of months ago.  Will catch with movement.  Thought that he might have slept wrong.  At this point, he is having pain with terminal abduction, terminal flexion, terminal internal range of motion.  Does have pain at night and a deep dull ache in the shoulder when he is rotating.  He has not found anything at home that really makes it feel better.  He has had a painful arc of motion for a few months, and he is now having some loss of motion. ? 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 ?Several years ?No fx or dislocation, surgery. ? ?Shoulder rehab ? ?L shoulder inj. ? ?Review of Systems is noted in the HPI, as appropriate ? ?Objective:  ? ?BP (!) 144/100   Pulse 73   Temp 97.9 ?F (36.6 ?C) (Oral)   Ht 6\' 2"  (1.88 m)   Wt 215 lb 2 oz (97.6 kg)   SpO2 97%   BMI 27.62 kg/m?  ? ?GEN: No acute distress; alert,appropriate. ?PULM: Breathing comfortably in no respiratory distress ?PSYCH: Normally interactive.  ? ? ?Shoulder: R and L ?Inspection: No muscle wasting or winging ?Ecchymosis/edema: neg  ?AC joint, scapula, clavicle: NT ?Cervical spine: NT, full  ROM ?Spurling's: neg ?ABNORMAL SIDE TESTED: Left ?UNLESS OTHERWISE NOTED, THE CONTRALATERAL SIDE HAS FULL RANGE OF MOTION. ?Abduction: 5/5, LIMITED TO 155 DEGREES ?Flexion: 5/5, LIMITED TO 160 DEGNO ROM  ?IR, lift-off: 5/5. TESTED AT 90 DEGREES OF ABDUCTION, LIMITED TO 8 DEGREES ?ER at neutral:  5/5, TESTED AT 90 DEGREES OF ABDUCTION, LIMITED TO 45 DEGREES ?AC crossover and compression: PAIN ?Drop Test: neg ?Empty Can: neg ?Supraspinatus insertion: NT ?Bicipital groove: NT ?ALL OTHER SPECIAL TESTING EQUIVOCAL GIVEN LOSS OF MOTION ?C5-T1 intact ?Sensation intact ?Grip 5/5   ? ?Laboratory and Imaging Data: ? ?Assessment and Plan:  ? ?  ICD-10-CM   ?1. Impingement syndrome of left shoulder  M75.42 triamcinolone acetonide (KENALOG-40) injection 40 mg  ?  ?2. Adhesive capsulitis of left shoulder  M75.02 triamcinolone acetonide (KENALOG-40) injection 40 mg  ?  ? ?Initial rotator cuff tendinopathy which now corresponds to secondary to use of capsulitis.  Often, secondary frozen shoulder does do better compared to primary.  Hopefully he will have a short-lived course. ? ?Right now I am can have him initiate [facility] protocol, and at follow-up I think physical therapy will help quite a bit. ? ?I am also going to do a diagnostic and therapeutic injection of corticosteroid into the intra-articular space. ? ?Intraarticular Shoulder Aspiration/Injection Procedure Note ?Andrew Mcconnell ?06/13/1970 ?Date of procedure: 08/11/2021 ? ?Procedure: Large Joint Aspiration / Injection of  Shoulder, Intraarticular, L ?Indications: Pain ? ?Procedure Details ?Verbal consent was obtained from the patient. Risks explained and contrasted with benefits and alternatives. Patient prepped with Chloraprep and Ethyl Chloride used for anesthesia. An intraarticular shoulder injection was performed using the posterior approach; needle placed into joint capsule without difficulty. The patient tolerated the procedure well and had decreased pain post  injection. No complications. ?Injection: 9 cc of Lidocaine 1% and 1 mL Kenalog 40 mg. ?Needle: 21 gauge, 2 inch  ? ? ?Meds ordered this encounter  ?Medications  ? triamcinolone acetonide (KENALOG-40) injection 40 mg  ? ?There are no discontinued medications. ?No orders of the defined types were placed in this encounter. ? ? ?Follow-up: No follow-ups on file. ? ?Dragon [employer] One speech-to-text software was used for transcription in this dictation.  Possible transcriptional errors can occur using Animal nutritionist.  ? ?Signed, ? ?Andrew Hitchens T. Yahaira Bruski, MD ? ? ?Outpatient Encounter Medications as of 08/11/2021  ?Medication Sig  ? esomeprazole (NEXIUM) 20 MG capsule Take 20 mg by mouth daily.  ? ibuprofen (ADVIL,MOTRIN) 600 MG tablet Take 1 tablet (600 mg total) by mouth every 8 (eight) hours as needed (for pain.  with food.).  ? Multiple Vitamin (MULTIVITAMIN) tablet Take 1 tablet by mouth daily.  ? SUMAtriptan (IMITREX) 100 MG tablet Take 1 tablet earliest onset of headache.  May repeat in 2 hours if headache persists or recurs.  Maximum 2 tablets in 24 hours.  ? tadalafil (CIALIS) 5 MG tablet Take 1 tablet (5 mg total) by mouth daily.  ? [EXPIRED] triamcinolone acetonide (KENALOG-40) injection 40 mg   ? ?No facility-administered encounter medications on file as of 08/11/2021.  ?  ?

## 2022-04-24 ENCOUNTER — Other Ambulatory Visit: Payer: Self-pay | Admitting: Family Medicine

## 2022-04-24 DIAGNOSIS — E785 Hyperlipidemia, unspecified: Secondary | ICD-10-CM

## 2022-04-24 DIAGNOSIS — Z125 Encounter for screening for malignant neoplasm of prostate: Secondary | ICD-10-CM

## 2022-04-25 NOTE — Addendum Note (Signed)
Addended by: Donnamarie Poag on: 04/25/2022 09:50 AM   Modules accepted: Orders

## 2022-04-28 ENCOUNTER — Other Ambulatory Visit: Payer: BC Managed Care – PPO

## 2022-05-05 ENCOUNTER — Encounter: Payer: BC Managed Care – PPO | Admitting: Family Medicine

## 2022-08-26 ENCOUNTER — Ambulatory Visit: Payer: BC Managed Care – PPO | Admitting: Family Medicine

## 2022-08-26 ENCOUNTER — Encounter: Payer: Self-pay | Admitting: Family Medicine

## 2022-08-26 VITALS — BP 142/100 | HR 81 | Temp 97.5°F | Ht 74.0 in | Wt 212.0 lb

## 2022-08-26 DIAGNOSIS — R110 Nausea: Secondary | ICD-10-CM | POA: Diagnosis not present

## 2022-08-26 NOTE — Patient Instructions (Signed)
Unclear source about your symptoms.  Go to the lab on the way out.   If you have mychart we'll likely use that to update you.    Take care.  Glad to see you.

## 2022-08-26 NOTE — Progress Notes (Unsigned)
Hadn't checked BP out of clinic.  He can do that intermittently.  Intermittent nausea for a few months, vomited once.  No specific trigger.  No black stools.  No bloody vomit.  He had some intermittent BRBPR.  Not lightheaded. Neg cologuard prev.   No fevers.  No chills.  No abd pain.    Taking ibuprofen rarely.  Still taking nexium but still having heartburn if off med for a few days.  Swallowing well.  No veritgo sx.    No CP. Not SOB.    Modest weight loss attributed to diet changes.    He cut back on etoh w/o change in sx.    Meds, vitals, and allergies reviewed.   ROS: Per HPI unless specifically indicated in ROS section   GEN: nad, alert and oriented HEENT: ncat NECK: supple w/o LA CV: rrr PULM: ctab, no inc wob ABD: soft, +bs, not ttp EXT: no edema SKIN: no acute rash

## 2022-08-27 LAB — CBC WITH DIFFERENTIAL/PLATELET
Absolute Monocytes: 465 cells/uL (ref 200–950)
Basophils Absolute: 19 cells/uL (ref 0–200)
Basophils Relative: 0.4 %
Eosinophils Absolute: 42 cells/uL (ref 15–500)
Eosinophils Relative: 0.9 %
HCT: 43.7 % (ref 38.5–50.0)
Hemoglobin: 15.3 g/dL (ref 13.2–17.1)
Lymphs Abs: 2223 cells/uL (ref 850–3900)
MCH: 32.9 pg (ref 27.0–33.0)
MCHC: 35 g/dL (ref 32.0–36.0)
MCV: 94 fL (ref 80.0–100.0)
MPV: 10.3 fL (ref 7.5–12.5)
Monocytes Relative: 9.9 %
Neutro Abs: 1951 cells/uL (ref 1500–7800)
Neutrophils Relative %: 41.5 %
Platelets: 349 10*3/uL (ref 140–400)
RBC: 4.65 10*6/uL (ref 4.20–5.80)
RDW: 13.1 % (ref 11.0–15.0)
Total Lymphocyte: 47.3 %
WBC: 4.7 10*3/uL (ref 3.8–10.8)

## 2022-08-27 LAB — COMPREHENSIVE METABOLIC PANEL
AG Ratio: 1.4 (calc) (ref 1.0–2.5)
ALT: 21 U/L (ref 9–46)
AST: 18 U/L (ref 10–35)
Albumin: 4.4 g/dL (ref 3.6–5.1)
Alkaline phosphatase (APISO): 87 U/L (ref 35–144)
BUN: 15 mg/dL (ref 7–25)
CO2: 24 mmol/L (ref 20–32)
Calcium: 9.7 mg/dL (ref 8.6–10.3)
Chloride: 104 mmol/L (ref 98–110)
Creat: 1.21 mg/dL (ref 0.70–1.30)
Globulin: 3.1 g/dL (calc) (ref 1.9–3.7)
Glucose, Bld: 94 mg/dL (ref 65–99)
Potassium: 4.2 mmol/L (ref 3.5–5.3)
Sodium: 141 mmol/L (ref 135–146)
Total Bilirubin: 1.1 mg/dL (ref 0.2–1.2)
Total Protein: 7.5 g/dL (ref 6.1–8.1)

## 2022-08-27 LAB — LIPASE: Lipase: 158 U/L — ABNORMAL HIGH (ref 7–60)

## 2022-08-28 ENCOUNTER — Telehealth: Payer: Self-pay | Admitting: Family Medicine

## 2022-08-28 DIAGNOSIS — K859 Acute pancreatitis without necrosis or infection, unspecified: Secondary | ICD-10-CM

## 2022-08-28 DIAGNOSIS — R110 Nausea: Secondary | ICD-10-CM | POA: Insufficient documentation

## 2022-08-28 NOTE — Assessment & Plan Note (Signed)
Of unclear source.  Okay for outpatient follow-up.  Benign abdominal exam.  Will check routine labs today.  See notes on labs.

## 2022-08-28 NOTE — Telephone Encounter (Signed)
Please call patient.  All of his labs are fine except for his lipase level is elevated.  This can happen when the patient has pancreatitis.  See if he is having any abdominal pain now.  He needs an abdominal CT to evaluate this.  I put in the orders for that, pended below.  Once you have contacted the patient I can sign the order.  Let me know if he is having any abdominal pain.  I would cease any alcohol in the meantime.  Thanks.

## 2022-08-29 ENCOUNTER — Encounter: Payer: Self-pay | Admitting: *Deleted

## 2022-08-29 NOTE — Telephone Encounter (Signed)
Patient notified of lab results and verbalized understanding. Patient has not has any abdominal pain but has had right side pain x 1 week. He is fine with doing CT and advised patient that he will get a call from radiology once order is signed.

## 2022-08-29 NOTE — Telephone Encounter (Signed)
I signed the order.  Please see if a clear liquid diet helps with the pain in the meantime.  Thanks.

## 2022-08-30 NOTE — Telephone Encounter (Signed)
Called patient and advised patient to try a clear liquid diet and see if that helps with his pain. Advised to call back if that does not. CT scan has been scheduled for 09/29/22.

## 2022-09-29 ENCOUNTER — Other Ambulatory Visit: Payer: BC Managed Care – PPO

## 2022-10-24 ENCOUNTER — Ambulatory Visit
Admission: RE | Admit: 2022-10-24 | Discharge: 2022-10-24 | Disposition: A | Discharge status: Home / Self Care | Payer: BC Managed Care – PPO | Source: Ambulatory Visit | Attending: Family Medicine | Admitting: Family Medicine

## 2022-10-24 DIAGNOSIS — N401 Enlarged prostate with lower urinary tract symptoms: Secondary | ICD-10-CM | POA: Diagnosis not present

## 2022-10-24 DIAGNOSIS — I700 Atherosclerosis of aorta: Secondary | ICD-10-CM | POA: Diagnosis not present

## 2022-10-24 DIAGNOSIS — K859 Acute pancreatitis without necrosis or infection, unspecified: Secondary | ICD-10-CM

## 2022-10-24 MED ORDER — IOPAMIDOL (ISOVUE-300) INJECTION 61%
100.0000 mL | Freq: Once | INTRAVENOUS | Status: AC | PRN
  Administered 2022-10-24: 100 mL via INTRAVENOUS

## 2022-11-01 ENCOUNTER — Telehealth: Payer: Self-pay | Admitting: Family Medicine

## 2022-11-01 NOTE — Telephone Encounter (Signed)
Results have been given to patient 

## 2022-11-01 NOTE — Telephone Encounter (Signed)
Patient's significant other called the office regarding CT results for the patient. Asked if whenever able, if patient could get a call regarding these results. Advised her that it was possible they had not yet had a chance to review or contact patient regarding these yet. Advised that someone would reach out to patient with these results.

## 2022-11-02 ENCOUNTER — Other Ambulatory Visit: Payer: Self-pay | Admitting: Family Medicine

## 2022-11-02 DIAGNOSIS — R748 Abnormal levels of other serum enzymes: Secondary | ICD-10-CM

## 2022-11-03 ENCOUNTER — Other Ambulatory Visit: Payer: Self-pay | Admitting: Family Medicine

## 2022-11-03 DIAGNOSIS — R748 Abnormal levels of other serum enzymes: Secondary | ICD-10-CM

## 2022-11-03 MED ORDER — TAMSULOSIN HCL 0.4 MG PO CAPS: 0.4000 mg | ORAL_CAPSULE | Freq: Every day | ORAL | 3 refills | Status: DC

## 2022-11-04 ENCOUNTER — Other Ambulatory Visit (INDEPENDENT_AMBULATORY_CARE_PROVIDER_SITE_OTHER): Payer: BC Managed Care – PPO

## 2022-11-04 DIAGNOSIS — R748 Abnormal levels of other serum enzymes: Secondary | ICD-10-CM

## 2022-11-04 DIAGNOSIS — E785 Hyperlipidemia, unspecified: Secondary | ICD-10-CM

## 2022-11-04 DIAGNOSIS — Z125 Encounter for screening for malignant neoplasm of prostate: Secondary | ICD-10-CM

## 2022-11-04 LAB — LIPASE: Lipase: 140 U/L — ABNORMAL HIGH (ref 11.0–59.0)

## 2022-11-04 LAB — PSA: PSA: 5.31 ng/mL — ABNORMAL HIGH (ref 0.10–4.00)

## 2022-11-04 LAB — LIPID PANEL
Cholesterol: 194 mg/dL (ref 0–200)
HDL: 50.4 mg/dL (ref >39.00)
LDL Cholesterol: 118 mg/dL — ABNORMAL HIGH (ref 0–99)
NonHDL: 143.34
Total CHOL/HDL Ratio: 4
Triglycerides: 129 mg/dL (ref 0.0–149.0)
VLDL: 25.8 mg/dL (ref 0.0–40.0)

## 2022-11-06 ENCOUNTER — Other Ambulatory Visit: Payer: Self-pay | Admitting: Family Medicine

## 2022-11-06 DIAGNOSIS — R972 Elevated prostate specific antigen [PSA]: Secondary | ICD-10-CM

## 2022-11-07 ENCOUNTER — Encounter: Payer: Self-pay | Admitting: *Deleted

## 2022-11-17 NOTE — Progress Notes (Signed)
Chief Complaint: Nausea, elevated lipase Primary GI MD: Gentry Fitz  HPI:  52 year old male who was referred to me by Joaquim Nam, MD for a complaint of elevated lipase .    CT abdomen pelvis with contrast done for nausea 10/28/2022 showed no acute findings in abdomen or pelvis.  Normal pancreas.  Enlarged prostate pushing on the bladder.  Lipase 2 months ago was 158.  Lipase 11/04/2022 was 140.  Normal LFTs.  No leukocytosis or anemia  Patient states for the last few months he has had intermittent nausea. States it comes on randomly and lasts a few seconds and then dissipates. No association with eating. Reports frequent GERD. Takes OTC medicine (unsure which one) every other day with some relief. Takes ibuprofen twice weekly occasionally.  Denies change in bowel habits. Had rectal bleeding last year which he attributed to hemorrhoids at that time. Denies weight loss. Denies family history of colon cancer.   PREVIOUS GI WORKUP   - no previous colonoscopy  Past Medical History:  Diagnosis Date   Allergy    Blood in stool    Hemorrhoids, external    History of chicken pox     No past surgical history on file.  Current Outpatient Medications  Medication Sig Dispense Refill   tamsulosin (FLOMAX) 0.4 MG CAPS capsule Take 1 capsule (0.4 mg total) by mouth daily. 90 capsule 3   esomeprazole (NEXIUM) 20 MG capsule Take 20 mg by mouth daily.     ibuprofen (ADVIL,MOTRIN) 600 MG tablet Take 1 tablet (600 mg total) by mouth every 8 (eight) hours as needed (for pain.  with food.). 30 tablet 1   Multiple Vitamin (MULTIVITAMIN) tablet Take 1 tablet by mouth daily.     SUMAtriptan (IMITREX) 100 MG tablet Take 1 tablet earliest onset of headache.  May repeat in 2 hours if headache persists or recurs.  Maximum 2 tablets in 24 hours. 10 tablet 5   tadalafil (CIALIS) 5 MG tablet Take 1 tablet (5 mg total) by mouth daily. 30 tablet 12   No current facility-administered medications for this  visit.    Allergies as of 11/18/2022   (No Known Allergies)    Family History  Problem Relation Age of Onset   Cancer Mother        cervical   Hypertension Other    Diabetes Other    Prostate cancer Neg Hx    Colon cancer Neg Hx     Social History   Socioeconomic History   Marital status: Single    Spouse name: Not on file   Number of children: Not on file   Years of education: Not on file   Highest education level: Not on file  Occupational History   Not on file  Tobacco Use   Smoking status: Never   Smokeless tobacco: Never  Substance and Sexual Activity   Alcohol use: Yes    Alcohol/week: 0.0 standard drinks of alcohol    Comment: occ   Drug use: No   Sexual activity: Not on file  Other Topics Concern   Not on file  Social History Narrative   Education:  High school   [employer] fan    Coached youth league basketball   Prev wireless network install, now doing warehouse work    3 kids   Social Determinants of Health   Financial Resource Strain: Not on file  Food Insecurity: Not on file  Transportation Needs: Not on file  Physical Activity: Not on  file  Stress: Not on file  Social Connections: Not on file  Intimate Partner Violence: Not on file    Review of Systems:    Constitutional: No weight loss, fever, chills, weakness or fatigue HEENT: Eyes: No change in vision               Ears, Nose, Throat:  No change in hearing or congestion Skin: No rash or itching Cardiovascular: No chest pain, chest pressure or palpitations   Respiratory: No SOB or cough Gastrointestinal: See HPI and otherwise negative Genitourinary: No dysuria or change in urinary frequency Neurological: No headache, dizziness or syncope Musculoskeletal: No new muscle or joint pain Hematologic: No bleeding or bruising Psychiatric: No history of depression or anxiety    Physical Exam:  Vital signs: There were no vitals taken for this visit.  Constitutional: NAD, Well developed, Well  nourished, alert and cooperative Head:  Normocephalic and atraumatic. Eyes:   PEERL, EOMI. No icterus. Conjunctiva pink. Respiratory: Respirations even and unlabored. Lungs clear to auscultation bilaterally.   No wheezes, crackles, or rhonchi.  Cardiovascular:  Regular rate and rhythm. No peripheral edema, cyanosis or pallor.  Gastrointestinal:  Soft, nondistended, nontender. No rebound or guarding. Normal bowel sounds. No appreciable masses or hepatomegaly. Rectal:  Not performed.  Msk:  Symmetrical without gross deformities. Without edema, no deformity or joint abnormality.  Neurologic:  Alert and  oriented x4;  grossly normal neurologically.  Skin:   Dry and intact without significant lesions or rashes. Psychiatric: Oriented to person, place and time. Demonstrates good judgement and reason without abnormal affect or behaviors.   RELEVANT LABS AND IMAGING: CBC    Component Value Date/Time   WBC 4.7 08/26/2022 1632   RBC 4.65 08/26/2022 1632   HGB 15.3 08/26/2022 1632   HCT 43.7 08/26/2022 1632   PLT 349 08/26/2022 1632   MCV 94.0 08/26/2022 1632   MCH 32.9 08/26/2022 1632   MCHC 35.0 08/26/2022 1632   RDW 13.1 08/26/2022 1632   LYMPHSABS 2,223 08/26/2022 1632   MONOABS 0.6 09/18/2020 0940   EOSABS 42 08/26/2022 1632   BASOSABS 19 08/26/2022 1632    CMP     Component Value Date/Time   NA 141 08/26/2022 1632   K 4.2 08/26/2022 1632   CL 104 08/26/2022 1632   CO2 24 08/26/2022 1632   GLUCOSE 94 08/26/2022 1632   BUN 15 08/26/2022 1632   CREATININE 1.21 08/26/2022 1632   CALCIUM 9.7 08/26/2022 1632   PROT 7.5 08/26/2022 1632   ALBUMIN 4.3 10/09/2020 0802   AST 18 08/26/2022 1632   AST 19 07/06/2017 0000   ALT 21 08/26/2022 1632   ALT 20 07/06/2017 0000   ALKPHOS 71 10/09/2020 0802   BILITOT 1.1 08/26/2022 1632    Assessment: 1. Elevated Lipase 2. Nausea 3. GERD - elevated lipase with normal CT scan. Intermittent nausea and history of GERD. Suspect nausea could  be related to GERD. Elevated lipase not from pancreatitis, suspect possible duodenitis.  4. Screening for colon cancer - no previous screening, no family history   Plan: 1. EGD to evaluate for esophagitis, gastritis, PUD, duodenitis. 2. Colonoscopy for colon cancer screening 3. I thoroughly discussed the procedure with the patient (at bedside) to include nature of the procedure, alternatives, benefits, and risks (including but not limited to bleeding, infection, perforation, anesthesia/cardiac pulmonary complications).  Patient verbalized understanding and gave verbal consent to proceed with procedure.  4. Trial of omeprazole 20mg  once daily 5. Educated patient on GERD  lifestyle modifications and provided patient education handout. 6. Follow up per procedure  [facility] 11/17/2022, 12:54 PM  Cc: Joaquim Nam, MD

## 2022-11-18 ENCOUNTER — Encounter: Payer: Self-pay | Admitting: Gastroenterology

## 2022-11-18 ENCOUNTER — Ambulatory Visit: Payer: BC Managed Care – PPO | Admitting: Gastroenterology

## 2022-11-18 VITALS — BP 140/90 | HR 73 | Ht 74.0 in | Wt 208.0 lb

## 2022-11-18 DIAGNOSIS — K219 Gastro-esophageal reflux disease without esophagitis: Secondary | ICD-10-CM | POA: Diagnosis not present

## 2022-11-18 DIAGNOSIS — Z1211 Encounter for screening for malignant neoplasm of colon: Secondary | ICD-10-CM | POA: Diagnosis not present

## 2022-11-18 DIAGNOSIS — R110 Nausea: Secondary | ICD-10-CM | POA: Diagnosis not present

## 2022-11-18 DIAGNOSIS — R748 Abnormal levels of other serum enzymes: Secondary | ICD-10-CM | POA: Diagnosis not present

## 2022-11-18 MED ORDER — NA SULFATE-K SULFATE-MG SULF 17.5-3.13-1.6 GM/177ML PO SOLN: 1.0000 | Freq: Once | ORAL | 0 refills | Status: AC

## 2022-11-18 MED ORDER — OMEPRAZOLE 20 MG PO CPDR: 20.0000 mg | DELAYED_RELEASE_CAPSULE | Freq: Every day | ORAL | 2 refills | Status: DC

## 2022-11-18 NOTE — Patient Instructions (Addendum)
You have been scheduled for an endoscopy and colonoscopy. Please follow the written instructions given to you at your visit today.  Please pick up your prep supplies at the pharmacy within the next 1-3 days.  If you use inhalers (even only as needed), please bring them with you on the day of your procedure.  DO NOT TAKE 7 DAYS PRIOR TO TEST- Trulicity (dulaglutide) Ozempic, Wegovy (semaglutide) Mounjaro (tirzepatide) Bydureon Bcise (exanatide extended release)  DO NOT TAKE 1 DAY PRIOR TO YOUR TEST Rybelsus (semaglutide) Adlyxin (lixisenatide) Victoza (liraglutide) Byetta (exanatide) ___________________________________________________________________________   We have sent the following medications to your pharmacy for you to pick up at your convenience:  Omeprazole 20 mg  You will follow up per recommendations after procedures  _______________________________________________________  If your blood pressure at your visit was 140/90 or greater, please contact your primary care physician to follow up on this.  _______________________________________________________  If you are age 70 or older, your body mass index should be between 23-30. Your Body mass index is 26.71 kg/m. If this is out of the aforementioned range listed, please consider follow up with your Primary Care Provider.  If you are age 53 or younger, your body mass index should be between 19-25. Your Body mass index is 26.71 kg/m. If this is out of the aformentioned range listed, please consider follow up with your Primary Care Provider.   ________________________________________________________  The [facility] providers would like to encourage you to use [facility] to communicate with providers for non-urgent requests or questions.  Due to long hold times on the telephone, sending your provider a message by [facility] may be a faster and more efficient way to get a response.  Please allow 48 business hours for a response.   Please remember that this is for non-urgent requests.  _______________________________________________________   I appreciate the  opportunity to care for you  Thank You   [facility]

## 2022-11-22 ENCOUNTER — Ambulatory Visit: Payer: BC Managed Care – PPO | Admitting: Family Medicine

## 2022-11-22 ENCOUNTER — Encounter: Payer: Self-pay | Admitting: Family Medicine

## 2022-11-22 VITALS — BP 144/102 | HR 72 | Temp 97.9°F | Ht 74.0 in | Wt 209.0 lb

## 2022-11-22 DIAGNOSIS — B079 Viral wart, unspecified: Secondary | ICD-10-CM | POA: Diagnosis not present

## 2022-11-22 DIAGNOSIS — R972 Elevated prostate specific antigen [PSA]: Secondary | ICD-10-CM

## 2022-11-22 DIAGNOSIS — R110 Nausea: Secondary | ICD-10-CM | POA: Diagnosis not present

## 2022-11-22 NOTE — Patient Instructions (Signed)
The wart should blister and then heal over.  Cover with a bandaid as needed.  Take care.  Glad to see you. I'll await the GI and urology notes.

## 2022-11-22 NOTE — Progress Notes (Unsigned)
Wart on left pointer finger. Patient states its been there for couple of months and has been treating with otc tx; wasn't sure what exactly he was using. He said it did help some.   D/w pt about GI f/u.  He has upper and lower endoscopy pending.  Lipase elevation.  He had 2 episodes of food poisoning this year but most recent one was after the last lipase elevation was noted.    D/w about urology f/u.  That is pending for 12/13/22.    He has dental eval pending re: an infected tooth.  He'll need a root canal.  No pain now.

## 2022-11-23 DIAGNOSIS — R972 Elevated prostate specific antigen [PSA]: Secondary | ICD-10-CM | POA: Insufficient documentation

## 2022-11-23 DIAGNOSIS — B079 Viral wart, unspecified: Secondary | ICD-10-CM | POA: Insufficient documentation

## 2022-11-23 NOTE — Assessment & Plan Note (Signed)
Discussed options.  Discussed cryotherapy.  Routine cautions given to patient.  He consented for treatment.  Lesion was frozen x 3 with adequate freeze/thaw cycle and tolerated well.  No complication.  Discussed that he may end up needing retreatment.  He can cover the lesion after blisters.

## 2022-11-23 NOTE — Assessment & Plan Note (Signed)
Await GI follow-up notes.

## 2022-11-23 NOTE — Assessment & Plan Note (Signed)
Urology evaluation pending, discussed with patient that PSA elevation does not necessarily mean he has a cancerous process.

## 2022-12-01 NOTE — Progress Notes (Signed)
Agree with assessment/plan.  Raj Gupta, MD [facility] [phone]  

## 2022-12-13 DIAGNOSIS — R3912 Poor urinary stream: Secondary | ICD-10-CM | POA: Diagnosis not present

## 2022-12-13 DIAGNOSIS — R972 Elevated prostate specific antigen [PSA]: Secondary | ICD-10-CM | POA: Diagnosis not present

## 2022-12-13 DIAGNOSIS — N401 Enlarged prostate with lower urinary tract symptoms: Secondary | ICD-10-CM | POA: Diagnosis not present

## 2022-12-15 ENCOUNTER — Encounter: Payer: Self-pay | Admitting: Family Medicine

## 2022-12-23 ENCOUNTER — Telehealth: Payer: Self-pay | Admitting: Gastroenterology

## 2022-12-23 MED ORDER — NA SULFATE-K SULFATE-MG SULF 17.5-3.13-1.6 GM/177ML PO SOLN: 1.0000 | Freq: Once | ORAL | 0 refills | Status: AC

## 2022-12-23 NOTE — Telephone Encounter (Signed)
I sent Suprep to requested pharmacy electronically today, we had [facility] in has his pharmacy.

## 2022-12-23 NOTE — Telephone Encounter (Signed)
PT needs to have Suprep sent to [facility] in [place]- [address]

## 2022-12-28 ENCOUNTER — Encounter: Payer: Self-pay | Admitting: Gastroenterology

## 2022-12-28 ENCOUNTER — Ambulatory Visit: Payer: BC Managed Care – PPO | Admitting: Gastroenterology

## 2022-12-28 VITALS — BP 135/89 | HR 58 | Temp 97.8°F | Resp 19 | Ht 74.0 in | Wt 208.0 lb

## 2022-12-28 DIAGNOSIS — K2970 Gastritis, unspecified, without bleeding: Secondary | ICD-10-CM | POA: Diagnosis not present

## 2022-12-28 DIAGNOSIS — R748 Abnormal levels of other serum enzymes: Secondary | ICD-10-CM

## 2022-12-28 DIAGNOSIS — K2951 Unspecified chronic gastritis with bleeding: Secondary | ICD-10-CM | POA: Diagnosis not present

## 2022-12-28 DIAGNOSIS — K2990 Gastroduodenitis, unspecified, without bleeding: Secondary | ICD-10-CM | POA: Diagnosis not present

## 2022-12-28 DIAGNOSIS — Z1211 Encounter for screening for malignant neoplasm of colon: Secondary | ICD-10-CM | POA: Diagnosis not present

## 2022-12-28 DIAGNOSIS — D128 Benign neoplasm of rectum: Secondary | ICD-10-CM

## 2022-12-28 DIAGNOSIS — K621 Rectal polyp: Secondary | ICD-10-CM | POA: Diagnosis not present

## 2022-12-28 DIAGNOSIS — K922 Gastrointestinal hemorrhage, unspecified: Secondary | ICD-10-CM | POA: Diagnosis not present

## 2022-12-28 DIAGNOSIS — K219 Gastro-esophageal reflux disease without esophagitis: Secondary | ICD-10-CM

## 2022-12-28 MED ORDER — HYDROCORTISONE ACETATE 25 MG RE SUPP: 25.0000 mg | Freq: Every day | RECTAL | 2 refills | Status: DC

## 2022-12-28 MED ORDER — ONDANSETRON HCL 4 MG PO TABS: 4.0000 mg | ORAL_TABLET | Freq: Three times a day (TID) | ORAL | 0 refills | Status: DC | PRN

## 2022-12-28 MED ORDER — SODIUM CHLORIDE 0.9 % IV SOLN: 500.0000 mL | Freq: Once | INTRAVENOUS | Status: DC

## 2022-12-28 NOTE — Progress Notes (Signed)
Chief Complaint: Nausea, elevated lipase Primary GI MD: Gentry Fitz   HPI:  52 year old male who was referred to me by Joaquim Nam, MD for a complaint of elevated lipase .     CT abdomen pelvis with contrast done for nausea 10/28/2022 showed no acute findings in abdomen or pelvis.  Normal pancreas.  Enlarged prostate pushing on the bladder.   Lipase 2 months ago was 158.  Lipase 11/04/2022 was 140.  Normal LFTs.  No leukocytosis or anemia   Patient states for the last few months he has had intermittent nausea. States it comes on randomly and lasts a few seconds and then dissipates. No association with eating. Reports frequent GERD. Takes OTC medicine (unsure which one) every other day with some relief. Takes ibuprofen twice weekly occasionally.   Denies change in bowel habits. Had rectal bleeding last year which he attributed to hemorrhoids at that time. Denies weight loss. Denies family history of colon cancer.     PREVIOUS GI WORKUP    - no previous colonoscopy       Past Medical History:  Diagnosis Date   Allergy     Blood in stool     Hemorrhoids, external     History of chicken pox            No past surgical history on file.             Current Outpatient Medications  Medication Sig Dispense Refill   tamsulosin (FLOMAX) 0.4 MG CAPS capsule Take 1 capsule (0.4 mg total) by mouth daily. 90 capsule 3   esomeprazole (NEXIUM) 20 MG capsule Take 20 mg by mouth daily.       ibuprofen (ADVIL,MOTRIN) 600 MG tablet Take 1 tablet (600 mg total) by mouth every 8 (eight) hours as needed (for pain.  with food.). 30 tablet 1   Multiple Vitamin (MULTIVITAMIN) tablet Take 1 tablet by mouth daily.       SUMAtriptan (IMITREX) 100 MG tablet Take 1 tablet earliest onset of headache.  May repeat in 2 hours if headache persists or recurs.  Maximum 2 tablets in 24 hours. 10 tablet 5   tadalafil (CIALIS) 5 MG tablet Take 1 tablet (5 mg total) by mouth daily. 30 tablet 12      No  current facility-administered medications for this visit.           Allergies as of 11/18/2022   (No Known Allergies)           Family History  Problem Relation Age of Onset   Cancer Mother          cervical   Hypertension Other     Diabetes Other     Prostate cancer Neg Hx     Colon cancer Neg Hx            Social History         Socioeconomic History   Marital status: Single      Spouse name: Not on file   Number of children: Not on file   Years of education: Not on file   Highest education level: Not on file  Occupational History   Not on file  Tobacco Use   Smoking status: Never   Smokeless tobacco: Never  Substance and Sexual Activity   Alcohol use: Yes      Alcohol/week: 0.0 standard drinks of alcohol      Comment: occ   Drug use: No   Sexual  activity: Not on file  Other Topics Concern   Not on file  Social History Narrative    Education:  High school    [employer] fan     Coached youth league basketball    Prev wireless network install, now doing warehouse work     3 kids    Social Determinants of Health    Financial Resource Strain: Not on file  Food Insecurity: Not on file  Transportation Needs: Not on file  Physical Activity: Not on file  Stress: Not on file  Social Connections: Not on file  Intimate Partner Violence: Not on file      Review of Systems:    Constitutional: No weight loss, fever, chills, weakness or fatigue HEENT: Eyes: No change in vision               Ears, Nose, Throat:  No change in hearing or congestion Skin: No rash or itching Cardiovascular: No chest pain, chest pressure or palpitations   Respiratory: No SOB or cough Gastrointestinal: See HPI and otherwise negative Genitourinary: No dysuria or change in urinary frequency Neurological: No headache, dizziness or syncope Musculoskeletal: No new muscle or joint pain Hematologic: No bleeding or bruising Psychiatric: No history of depression or anxiety      Physical Exam:   Vital signs: There were no vitals taken for this visit.   Constitutional: NAD, Well developed, Well nourished, alert and cooperative Head:  Normocephalic and atraumatic. Eyes:   PEERL, EOMI. No icterus. Conjunctiva pink. Respiratory: Respirations even and unlabored. Lungs clear to auscultation bilaterally.   No wheezes, crackles, or rhonchi.  Cardiovascular:  Regular rate and rhythm. No peripheral edema, cyanosis or pallor.  Gastrointestinal:  Soft, nondistended, nontender. No rebound or guarding. Normal bowel sounds. No appreciable masses or hepatomegaly. Rectal:  Not performed.  Msk:  Symmetrical without gross deformities. Without edema, no deformity or joint abnormality.  Neurologic:  Alert and  oriented x4;  grossly normal neurologically.  Skin:   Dry and intact without significant lesions or rashes. Psychiatric: Oriented to person, place and time. Demonstrates good judgement and reason without abnormal affect or behaviors.     RELEVANT LABS AND IMAGING: CBC Labs (Brief)          Component Value Date/Time    WBC 4.7 08/26/2022 1632    RBC 4.65 08/26/2022 1632    HGB 15.3 08/26/2022 1632    HCT 43.7 08/26/2022 1632    PLT 349 08/26/2022 1632    MCV 94.0 08/26/2022 1632    MCH 32.9 08/26/2022 1632    MCHC 35.0 08/26/2022 1632    RDW 13.1 08/26/2022 1632    LYMPHSABS 2,223 08/26/2022 1632    MONOABS 0.6 09/18/2020 0940    EOSABS 42 08/26/2022 1632    BASOSABS 19 08/26/2022 1632        CMP     Labs (Brief)          Component Value Date/Time    NA 141 08/26/2022 1632    K 4.2 08/26/2022 1632    CL 104 08/26/2022 1632    CO2 24 08/26/2022 1632    GLUCOSE 94 08/26/2022 1632    BUN 15 08/26/2022 1632    CREATININE 1.21 08/26/2022 1632    CALCIUM 9.7 08/26/2022 1632    PROT 7.5 08/26/2022 1632    ALBUMIN 4.3 10/09/2020 0802    AST 18 08/26/2022 1632    AST 19 07/06/2017 0000    ALT 21 08/26/2022 1632  ALT 20 07/06/2017 0000    ALKPHOS 71 10/09/2020 0802     BILITOT 1.1 08/26/2022 1632        Assessment: 1. Elevated Lipase 2. Nausea 3. GERD - elevated lipase with normal CT scan. Intermittent nausea and history of GERD. Suspect nausea could be related to GERD. Elevated lipase not from pancreatitis, suspect possible duodenitis.   4. Screening for colon cancer - no previous screening, no family history     Plan: 1. EGD to evaluate for esophagitis, gastritis, PUD, duodenitis. 2. Colonoscopy for colon cancer screening 3. I thoroughly discussed the procedure with the patient (at bedside) to include nature of the procedure, alternatives, benefits, and risks (including but not limited to bleeding, infection, perforation, anesthesia/cardiac pulmonary complications).  Patient verbalized understanding and gave verbal consent to proceed with procedure.  4. Trial of omeprazole 20mg  once daily 5. Educated patient on GERD lifestyle modifications and provided patient education handout. 6. Follow up per procedure   [facility]     Attending physician's note   I have taken history, reviewed the chart and examined the patient. I performed a substantive portion of this encounter, including complete performance of at least one of the key components, in conjunction with the APP. I agree with the Advanced Practitioner's note, impression and recommendations.   For EGD/colon today   Edman Circle, MD [facility] [phone]

## 2022-12-28 NOTE — Progress Notes (Signed)
Called to room to assist during endoscopic procedure.  Patient ID and intended procedure confirmed with present staff. Received instructions for my participation in the procedure from the performing physician.  

## 2022-12-28 NOTE — Op Note (Signed)
[facility] Patient Name: Andrew Mcconnell Procedure Date: 12/28/2022 3:14 PM MRN: [id number] Endoscopist: Lynann Bologna , MD, [id number] Age: 52 Referring MD:  Date of Birth: [birth year] Gender: Male Account #: [id number] Procedure:                Colonoscopy Indications:              Screening for colorectal malignant neoplasm Medicines:                Monitored Anesthesia Care Procedure:                Pre-Anesthesia Assessment:                           - Prior to the procedure, a History and Physical                            was performed, and patient medications and                            allergies were reviewed. The patient's tolerance of                            previous anesthesia was also reviewed. The risks                            and benefits of the procedure and the sedation                            options and risks were discussed with the patient.                            All questions were answered, and informed consent                            was obtained. Prior Anticoagulants: The patient has                            taken no anticoagulant or antiplatelet agents. ASA                            Grade Assessment: II - A patient with mild systemic                            disease. After reviewing the risks and benefits,                            the patient was deemed in satisfactory condition to                            undergo the procedure.                           After obtaining informed consent, the colonoscope  was passed under direct vision. Throughout the                            procedure, the patient's blood pressure, pulse, and                            oxygen saturations were monitored continuously. The                            CF [id number] #[id number] was introduced through the anus                            and advanced to the the cecum, identified by                            appendiceal orifice  and ileocecal valve. The                            colonoscopy was performed without difficulty. The                            patient tolerated the procedure well. The quality                            of the bowel preparation was good. The ileocecal                            valve, appendiceal orifice, and rectum were                            photographed. Scope In: 3:37:06 PM Scope Out: 3:47:43 PM Scope Withdrawal Time: 0 hours 8 minutes 35 seconds  Total Procedure Duration: 0 hours 10 minutes 37 seconds  Findings:                 Two sessile polyps were found in the proximal                            rectum and mid rectum. The polyps were 4 to 6 mm in                            size. These polyps were removed with a cold snare.                            Resection and retrieval were complete.                           Few diverticula visualized in the right colon.                            Non-bleeding internal hemorrhoids were found during                            retroflexion and during perianal exam. The  hemorrhoids were moderate and Grade I (internal                            hemorrhoids that do not prolapse).                           The exam was otherwise without abnormality on                            direct and retroflexion views. Complications:            No immediate complications. Estimated Blood Loss:     Estimated blood loss: none. Impression:               - Two 4 to 6 mm polyps in the proximal rectum and                            in the mid rectum, removed with a cold snare.                            Resected and retrieved.                           - Minimal right colonic diverticulosis.                           - Non-bleeding internal hemorrhoids.                           - The examination was otherwise normal on direct                            and retroflexion views. Recommendation:           - Patient has a contact  number available for                            emergencies. The signs and symptoms of potential                            delayed complications were discussed with the                            patient. Return to normal activities tomorrow.                            Written discharge instructions were provided to the                            patient.                           - Resume previous diet.                           - Continue present medications.                           -  Use hydrocortisone suppository 25 mg 1 per rectum                            once a day for 10 days. 2RF. If with any anorectal                            problems, would recommend hemorrhoidal banding.                           - Await pathology results.                           - Repeat colonoscopy based on pathology results. Lynann Bologna, MD 12/28/2022 3:58:08 PM This report has been signed electronically.

## 2022-12-28 NOTE — Op Note (Signed)
[facility] Patient Name: Andrew Mcconnell Procedure Date: 12/28/2022 3:19 PM MRN: [id number] Endoscopist: Lynann Bologna , MD, [id number] Age: 52 Referring MD:  Date of Birth: 04/29/1971 Gender: Male Account #: [id number] Procedure:                Upper GI endoscopy Indications:              Abn lipase with neg CT AP. Medicines:                Monitored Anesthesia Care Procedure:                Pre-Anesthesia Assessment:                           - Prior to the procedure, a History and Physical                            was performed, and patient medications and                            allergies were reviewed. The patient's tolerance of                            previous anesthesia was also reviewed. The risks                            and benefits of the procedure and the sedation                            options and risks were discussed with the patient.                            All questions were answered, and informed consent                            was obtained. Prior Anticoagulants: The patient has                            taken no anticoagulant or antiplatelet agents. ASA                            Grade Assessment: II - A patient with mild systemic                            disease. After reviewing the risks and benefits,                            the patient was deemed in satisfactory condition to                            undergo the procedure.                           After obtaining informed consent, the endoscope was  passed under direct vision. Throughout the                            procedure, the patient's blood pressure, pulse, and                            oxygen saturations were monitored continuously. The                            Olympus Scope [id number] was introduced through the                            mouth, and advanced to the second part of duodenum.                            The upper GI endoscopy  was accomplished without                            difficulty. The patient tolerated the procedure                            well. Scope In: Scope Out: Findings:                 The examined esophagus was normal.                           The Z-line was regular and was found 40 cm from the                            incisors.                           Localized mild inflammation characterized by                            erythema and granularity was found in the gastric                            fundus (2 cm x 2 cm) and in the gastric antrum.                            Biopsies were taken with a cold forceps for                            histology and sent in separate jars.                           Localized mild inflammation characterized by                            erythema and granularity was found in the duodenal                            bulb.  The exam was otherwise without abnormality. Complications:            No immediate complications. Estimated Blood Loss:     Estimated blood loss: none. Impression:               - Mild gastroduodenitis.                           - Localized gastritis in fundus (? Importance                            -biopsied) Recommendation:           - Patient has a contact number available for                            emergencies. The signs and symptoms of potential                            delayed complications were discussed with the                            patient. Return to normal activities tomorrow.                            Written discharge instructions were provided to the                            patient.                           - Resume previous diet.                           - Continue omeprazole 20 mg p.o. daily #90, 2 RF.                           - Await pathology results.                           - The findings and recommendations were discussed                            with the  patient's family. Lynann Bologna, MD 12/28/2022 3:54:03 PM This report has been signed electronically.

## 2022-12-28 NOTE — Progress Notes (Signed)
Sedate, gd SR, tolerated procedure well, VSS, report to RN 

## 2022-12-28 NOTE — Progress Notes (Signed)
Pt's states no medical or surgical changes since previsit or office visit. 

## 2022-12-28 NOTE — Patient Instructions (Signed)
Please read handouts provided. Continue present medications. Await pathology results. Zofran 4 mg every 8 hours as needed. Continue omeprazole 20 mg daily. Hydrocortisone suppository 25 mg 1 per rectum once a day for 10 days.   YOU HAD AN ENDOSCOPIC PROCEDURE TODAY AT THE [facility]:   Refer to the procedure report that was given to you for any specific questions about what was found during the examination.  If the procedure report does not answer your questions, please call your gastroenterologist to clarify.  If you requested that your care partner not be given the details of your procedure findings, then the procedure report has been included in a sealed envelope for you to review at your convenience later.  YOU SHOULD EXPECT: Some feelings of bloating in the abdomen. Passage of more gas than usual.  Walking can help get rid of the air that was put into your GI tract during the procedure and reduce the bloating. If you had a lower endoscopy (such as a colonoscopy or flexible sigmoidoscopy) you may notice spotting of blood in your stool or on the toilet paper. If you underwent a bowel prep for your procedure, you may not have a normal bowel movement for a few days.  Please Note:  You might notice some irritation and congestion in your nose or some drainage.  This is from the oxygen used during your procedure.  There is no need for concern and it should clear up in a day or so.  SYMPTOMS TO REPORT IMMEDIATELY:  Following lower endoscopy (colonoscopy or flexible sigmoidoscopy):  Excessive amounts of blood in the stool  Significant tenderness or worsening of abdominal pains  Swelling of the abdomen that is new, acute  Fever of 100F or higher  Following upper endoscopy (EGD)  Vomiting of blood or coffee ground material  New chest pain or pain under the shoulder blades  Painful or persistently difficult swallowing  New shortness of breath  Fever of 100F or higher  Black,  tarry-looking stools  For urgent or emergent issues, a gastroenterologist can be reached at any hour by calling [phone]. Do not use MyChart messaging for urgent concerns.    DIET:  We do recommend a small meal at first, but then you may proceed to your regular diet.  Drink plenty of fluids but you should avoid alcoholic beverages for 24 hours.  ACTIVITY:  You should plan to take it easy for the rest of today and you should NOT DRIVE or use heavy machinery until tomorrow (because of the sedation medicines used during the test).    FOLLOW UP: Our staff will call the number listed on your records the next business day following your procedure.  We will call around 7:15- 8:00 am to check on you and address any questions or concerns that you may have regarding the information given to you following your procedure. If we do not reach you, we will leave a message.     If any biopsies were taken you will be contacted by phone or by letter within the next 1-3 weeks.  Please call us at [phone] if you have not heard about the biopsies in 3 weeks.    SIGNATURES/CONFIDENTIALITY: You and/or your care partner have signed paperwork which will be entered into your electronic medical record.  These signatures attest to the fact that that the information above on your After Visit Summary has been reviewed and is understood.  Full responsibility of the confidentiality of this discharge  information lies with you and/or your care-partner.

## 2022-12-29 ENCOUNTER — Telehealth: Payer: Self-pay

## 2022-12-29 ENCOUNTER — Telehealth: Payer: Self-pay | Admitting: Family Medicine

## 2022-12-29 NOTE — Telephone Encounter (Signed)
Forms placed in Dr. Lianne Bushy folder

## 2022-12-29 NOTE — Telephone Encounter (Signed)
  Follow up Call-     12/28/2022    2:28 PM  Call back number  Post procedure Call Back phone  # [phone]  Permission to leave phone message Yes     Patient questions:  Do you have a fever, pain , or abdominal swelling? No. Pain Score  0 *  Have you tolerated food without any problems? Yes.    Have you been able to return to your normal activities? Yes.    Do you have any questions about your discharge instructions: Diet   No. Medications  No. Follow up visit  No.  Do you have questions or concerns about your Care? No.  Actions: * If pain score is 4 or above: No action needed, pain <4.

## 2022-12-29 NOTE — Telephone Encounter (Signed)
Type of forms received: fmla ppw   Routed to: [employer]   Paperwork received by : Audree Camel     Individual made aware of 3-5 business day turn around (Y/N): Y   Form completed and patient made aware of charges(Y/N): Y    Faxed to : pt's wife requested for ppw to be faxed to [phone], # is also listed on ppw. Pt's wife also requested for office to let pt know ppw has been faxed.   Form location:  [employer]

## 2023-01-02 NOTE — Telephone Encounter (Signed)
Forms have been faxed and wife notified. Copy made to give to patient tomorrow at appt.

## 2023-01-03 ENCOUNTER — Ambulatory Visit (INDEPENDENT_AMBULATORY_CARE_PROVIDER_SITE_OTHER): Payer: BC Managed Care – PPO | Admitting: Family Medicine

## 2023-01-03 ENCOUNTER — Encounter: Payer: Self-pay | Admitting: Family Medicine

## 2023-01-03 ENCOUNTER — Encounter: Payer: Self-pay | Admitting: Gastroenterology

## 2023-01-03 VITALS — BP 128/80 | HR 99 | Temp 97.8°F | Ht 74.0 in | Wt 207.0 lb

## 2023-01-03 DIAGNOSIS — B079 Viral wart, unspecified: Secondary | ICD-10-CM | POA: Diagnosis not present

## 2023-01-03 DIAGNOSIS — R110 Nausea: Secondary | ICD-10-CM

## 2023-01-03 NOTE — Progress Notes (Unsigned)
F/u re: wart.  Refrozen with Liq N2 x3.  He consented.   D/w pt about GI eval and path report.  No malignancy seen and I need GI input going forward.

## 2023-01-03 NOTE — Patient Instructions (Signed)
It should blister and then start to heal.  When it heals, try using compound W on any residual wart.  Take care.  Glad to see you.

## 2023-01-04 NOTE — Assessment & Plan Note (Signed)
Frozen x 3 with liquid nitrogen.  Routine cautions and instructions given to patient.  It should blister and then start to heal.  When it heals, try using compound W on any residual wart.  Update me as needed.

## 2023-01-04 NOTE — Assessment & Plan Note (Signed)
Per GI.  See above.

## 2023-01-05 ENCOUNTER — Other Ambulatory Visit: Payer: Self-pay

## 2023-01-05 ENCOUNTER — Telehealth: Payer: Self-pay | Admitting: Gastroenterology

## 2023-01-05 MED ORDER — HYDROCORTISONE (PERIANAL) 2.5 % EX CREA: 1.0000 | TOPICAL_CREAM | Freq: Two times a day (BID) | CUTANEOUS | 1 refills | Status: DC

## 2023-01-05 NOTE — Telephone Encounter (Signed)
Spoke with patient's significant other. She inquired about hemorrhoid banding, but said MD wanted him to try the cream first. Advised that patient follow MD recommendations & if cream does not improve symptoms then to call back and let us know and we can work on scheduling an appointment. She verbalized all understanding.

## 2023-01-05 NOTE — Telephone Encounter (Signed)
Patients girlfriend called to get path results also stated the Anusol medication was really expensive seeking an alternative.

## 2023-01-05 NOTE — Telephone Encounter (Signed)
I sent in the cream - hopefully that will be cheaper

## 2023-01-05 NOTE — Telephone Encounter (Signed)
Called & spoke with patient's significant other regarding the path result letter sent to patient's mychart from Dr. Chales Abrahams. Advised he give [place] a call if symptoms do not improve/worsen. She did mention the Ansuol Suppositories were over $100 for patient & this is not affordable. Requesting an alternative.

## 2023-01-24 ENCOUNTER — Ambulatory Visit: Payer: BC Managed Care – PPO | Admitting: Physician Assistant

## 2023-02-06 ENCOUNTER — Encounter: Payer: Self-pay | Admitting: Family Medicine

## 2023-02-06 ENCOUNTER — Ambulatory Visit: Payer: BC Managed Care – PPO | Admitting: Family Medicine

## 2023-02-06 VITALS — BP 138/90 | HR 92 | Temp 98.3°F | Ht 74.0 in | Wt 200.0 lb

## 2023-02-06 DIAGNOSIS — B079 Viral wart, unspecified: Secondary | ICD-10-CM

## 2023-02-06 DIAGNOSIS — L989 Disorder of the skin and subcutaneous tissue, unspecified: Secondary | ICD-10-CM

## 2023-02-06 NOTE — Progress Notes (Signed)
L 2nd finger with 5x11mm warty lesion.  No ulceration.  Prev frozen on two occasions but didn't blister with liq N2 treatment. Discussed options.   Meds, vitals, and allergies reviewed.   ROS: Per HPI unless specifically indicated in ROS section   Nad L 2nd finger with 5x66mm warty lesion.  No ulceration.

## 2023-02-06 NOTE — Assessment & Plan Note (Signed)
No charge for visit.  I think it makes sense to see dermatology given the location and likely need for excision due to lack of response to liq N2.  He agrees.  Refer.

## 2023-02-06 NOTE — Patient Instructions (Signed)
No charge for visit.  You should get a call about seeing dermatology.  Take care.  Glad to see you.

## 2023-02-08 ENCOUNTER — Emergency Department (HOSPITAL_BASED_OUTPATIENT_CLINIC_OR_DEPARTMENT_OTHER)
Admission: EM | Admit: 2023-02-08 | Discharge: 2023-02-08 | Disposition: A | Discharge status: Home / Self Care | Payer: BC Managed Care – PPO

## 2023-02-08 ENCOUNTER — Other Ambulatory Visit: Payer: Self-pay

## 2023-02-08 DIAGNOSIS — Z79899 Other long term (current) drug therapy: Secondary | ICD-10-CM | POA: Diagnosis not present

## 2023-02-08 DIAGNOSIS — D72829 Elevated white blood cell count, unspecified: Secondary | ICD-10-CM | POA: Insufficient documentation

## 2023-02-08 DIAGNOSIS — N3001 Acute cystitis with hematuria: Secondary | ICD-10-CM | POA: Diagnosis not present

## 2023-02-08 DIAGNOSIS — I10 Essential (primary) hypertension: Secondary | ICD-10-CM | POA: Diagnosis not present

## 2023-02-08 DIAGNOSIS — C61 Malignant neoplasm of prostate: Secondary | ICD-10-CM | POA: Diagnosis not present

## 2023-02-08 DIAGNOSIS — R972 Elevated prostate specific antigen [PSA]: Secondary | ICD-10-CM | POA: Diagnosis not present

## 2023-02-08 LAB — URINALYSIS, ROUTINE W REFLEX MICROSCOPIC
Glucose, UA: NEGATIVE mg/dL
Ketones, ur: >=80 mg/dL — AB
Leukocytes,Ua: NEGATIVE
Nitrite: POSITIVE — AB
Protein, ur: >=300 mg/dL — AB
Specific Gravity, Urine: 1.025 (ref 1.005–1.030)
pH: 7 (ref 5.0–8.0)

## 2023-02-08 LAB — LACTIC ACID, PLASMA
Lactic Acid, Venous: 2.2 mmol/L (ref 0.5–1.9)
Lactic Acid, Venous: 1.7 mmol/L (ref 0.5–1.9)

## 2023-02-08 LAB — CBC WITH DIFFERENTIAL/PLATELET
Abs Immature Granulocytes: 0.03 10*3/uL (ref 0.00–0.07)
Basophils Absolute: 0 10*3/uL (ref 0.0–0.1)
Basophils Relative: 0 %
Eosinophils Absolute: 0 10*3/uL (ref 0.0–0.5)
Eosinophils Relative: 0 %
HCT: 48.3 % (ref 39.0–52.0)
Hemoglobin: 16.7 g/dL (ref 13.0–17.0)
Immature Granulocytes: 0 %
Lymphocytes Relative: 7 %
Lymphs Abs: 0.8 10*3/uL (ref 0.7–4.0)
MCH: 33.6 pg (ref 26.0–34.0)
MCHC: 34.6 g/dL (ref 30.0–36.0)
MCV: 97.2 fL (ref 80.0–100.0)
Monocytes Absolute: 0.6 10*3/uL (ref 0.1–1.0)
Monocytes Relative: 5 %
Neutro Abs: 11.2 10*3/uL — ABNORMAL HIGH (ref 1.7–7.7)
Neutrophils Relative %: 88 %
Platelets: 242 10*3/uL (ref 150–400)
RBC: 4.97 MIL/uL (ref 4.22–5.81)
RDW: 13.2 % (ref 11.5–15.5)
WBC: 12.7 10*3/uL — ABNORMAL HIGH (ref 4.0–10.5)
nRBC: 0 % (ref 0.0–0.2)

## 2023-02-08 LAB — COMPREHENSIVE METABOLIC PANEL
ALT: 27 U/L (ref 0–44)
AST: 27 U/L (ref 15–41)
Albumin: 4.7 g/dL (ref 3.5–5.0)
Alkaline Phosphatase: 75 U/L (ref 38–126)
Anion gap: 14 (ref 5–15)
BUN: 13 mg/dL (ref 6–20)
CO2: 26 mmol/L (ref 22–32)
Calcium: 9.4 mg/dL (ref 8.9–10.3)
Chloride: 97 mmol/L — ABNORMAL LOW (ref 98–111)
Creatinine, Ser: 1.15 mg/dL (ref 0.61–1.24)
GFR, Estimated: >60 mL/min (ref >60)
Glucose, Bld: 110 mg/dL — ABNORMAL HIGH (ref 70–99)
Potassium: 3.6 mmol/L (ref 3.5–5.1)
Sodium: 137 mmol/L (ref 135–145)
Total Bilirubin: 2.1 mg/dL — ABNORMAL HIGH (ref 0.3–1.2)
Total Protein: 8.8 g/dL — ABNORMAL HIGH (ref 6.5–8.1)

## 2023-02-08 LAB — URINALYSIS, MICROSCOPIC (REFLEX): RBC / HPF: >50 RBC/hpf (ref 0–5)

## 2023-02-08 LAB — LIPASE, BLOOD: Lipase: 22 U/L (ref 11–51)

## 2023-02-08 MED ORDER — CEPHALEXIN 500 MG PO CAPS: 500.0000 mg | ORAL_CAPSULE | Freq: Four times a day (QID) | ORAL | 0 refills | Status: DC

## 2023-02-08 MED ORDER — LACTATED RINGERS IV BOLUS
1000.0000 mL | Freq: Once | INTRAVENOUS | Status: AC
  Administered 2023-02-08: 1000 mL via INTRAVENOUS

## 2023-02-08 MED ORDER — CEPHALEXIN 500 MG PO CAPS
500.0000 mg | ORAL_CAPSULE | Freq: Four times a day (QID) | ORAL | 0 refills | Status: DC
Start: 2023-02-08 — End: 2023-02-08

## 2023-02-08 MED ORDER — ONDANSETRON HCL 4 MG PO TABS: 4.0000 mg | ORAL_TABLET | Freq: Four times a day (QID) | ORAL | 0 refills | Status: DC

## 2023-02-08 MED ORDER — ONDANSETRON HCL 4 MG/2ML IJ SOLN
4.0000 mg | Freq: Once | INTRAMUSCULAR | Status: AC
  Administered 2023-02-08: 4 mg via INTRAVENOUS
  Filled 2023-02-08: qty 2

## 2023-02-08 MED ORDER — AMLODIPINE BESYLATE 5 MG PO TABS
5.0000 mg | ORAL_TABLET | Freq: Every day | ORAL | 0 refills | Status: DC
Start: 2023-02-08 — End: 2023-03-07

## 2023-02-08 MED ORDER — AMLODIPINE BESYLATE 5 MG PO TABS
5.0000 mg | ORAL_TABLET | Freq: Every day | ORAL | 0 refills | Status: DC
Start: 2023-02-08 — End: 2023-02-08

## 2023-02-08 MED ORDER — LACTATED RINGERS IV BOLUS: 1000.0000 mL | Freq: Once | INTRAVENOUS | Status: DC

## 2023-02-08 NOTE — ED Notes (Signed)
Date and time results received: 02/08/23 1603  Test: Lactic acid Critical Value: 2.2  Name of Provider Notified: Young   Orders Received? Or Actions Taken?:

## 2023-02-08 NOTE — ED Provider Notes (Signed)
[place] [facility] AT [facility] Provider Note   CSN: [id number] Arrival date & time: 02/08/23  1430     History  Chief Complaint  Patient presents with   Post-op Problem    Andrew Mcconnell is a 52 y.o. male history of hypertension without blood pressure meds, PSA elevation status post prostate biopsy that occurred at 8:45 AM this morning presented for postop complication.  Significant other was present to assist in history.  Both state that patient after getting home from the biopsy began vomiting been nauseous.  Significant other states that this is unable to get back to having a fever the biopsy was concerning.  Both deny hematemesis, fevers, chest pain, shortness of breath, abdominal pain, bowel symptoms.  Patient states that he is urinating blood however told that this was normal after the biopsy.  Urologist: [facility]  Home Medications Prior to Admission medications   Medication Sig Start Date End Date Taking? Authorizing Provider  amLODipine (NORVASC) 5 MG tablet Take 1 tablet (5 mg total) by mouth daily. 02/08/23 03/10/23 Yes Wajiha Versteeg, Beverly Gust, PA-C  cephALEXin (KEFLEX) 500 MG capsule Take 1 capsule (500 mg total) by mouth 4 (four) times daily. 02/08/23  Yes Loreley Schwall, Fayrene Fearing T, PA-C  ondansetron (ZOFRAN) 4 MG tablet Take 1 tablet (4 mg total) by mouth every 6 (six) hours. 02/08/23  Yes Miche Loughridge, Beverly Gust, PA-C  hydrocortisone (ANUSOL-HC) 2.5 % rectal cream Place 1 Application rectally 2 (two) times daily. 01/05/23   Iva Boop, MD  ibuprofen (ADVIL,MOTRIN) 600 MG tablet Take 1 tablet (600 mg total) by mouth every 8 (eight) hours as needed (for pain.  with food.). 04/01/15   Joaquim Nam, MD  Multiple Vitamin (MULTIVITAMIN) tablet Take 1 tablet by mouth daily.    [provider]  omeprazole (PRILOSEC) 20 MG capsule Take 1 capsule (20 mg total) by mouth daily. 11/18/22   McMichael, Saddie Benders, PA-C  ondansetron (ZOFRAN) 4 MG tablet Take 1 tablet  (4 mg total) by mouth every 8 (eight) hours as needed for nausea or vomiting. 12/28/22   Lynann Bologna, MD  SUMAtriptan (IMITREX) 100 MG tablet Take 1 tablet earliest onset of headache.  May repeat in 2 hours if headache persists or recurs.  Maximum 2 tablets in 24 hours. 11/27/20   Drema Dallas, DO  tadalafil (CIALIS) 5 MG tablet Take 1 tablet (5 mg total) by mouth daily. 05/09/19   Joaquim Nam, MD  tamsulosin (FLOMAX) 0.4 MG CAPS capsule Take 1 capsule (0.4 mg total) by mouth daily. 11/03/22   Joaquim Nam, MD      Allergies    Patient has no known allergies.    Review of Systems   Review of Systems  Physical Exam Updated Vital Signs BP (!) 203/132 (BP Location: Right Arm)   Pulse 75   Temp 97.8 F (36.6 C)   Resp 20   Ht 6\' 2"  (1.88 m)   Wt 95.3 kg   SpO2 100%   BMI 26.96 kg/m  Physical Exam Vitals reviewed.  Constitutional:      General: He is not in acute distress. HENT:     Head: Normocephalic and atraumatic.  Eyes:     Extraocular Movements: Extraocular movements intact.     Conjunctiva/sclera: Conjunctivae normal.     Pupils: Pupils are equal, round, and reactive to light.  Cardiovascular:     Rate and Rhythm: Normal rate and regular rhythm.     Pulses: Normal  pulses.     Heart sounds: Normal heart sounds.     Comments: 2+ bilateral radial/dorsalis pedis pulses with regular rate Pulmonary:     Effort: Pulmonary effort is normal. No respiratory distress.     Breath sounds: Normal breath sounds.  Abdominal:     Palpations: Abdomen is soft.     Tenderness: There is no abdominal tenderness. There is no guarding or rebound.  Musculoskeletal:        General: Normal range of motion.     Cervical back: Normal range of motion and neck supple.     Comments: 5 out of 5 bilateral grip/leg extension strength  Skin:    General: Skin is warm and dry.     Capillary Refill: Capillary refill takes less than 2 seconds.  Neurological:     General: No focal deficit  present.     Mental Status: He is alert and oriented to person, place, and time.     Comments: Sensation intact in all 4 limbs  Psychiatric:        Mood and Affect: Mood normal.     ED Results / Procedures / Treatments   Labs (all labs ordered are listed, but only abnormal results are displayed) Labs Reviewed  CBC WITH DIFFERENTIAL/PLATELET - Abnormal; Notable for the following components:      Result Value   WBC 12.7 (*)    Neutro Abs 11.2 (*)    All other components within normal limits  COMPREHENSIVE METABOLIC PANEL - Abnormal; Notable for the following components:   Chloride 97 (*)    Glucose, Bld 110 (*)    Total Protein 8.8 (*)    Total Bilirubin 2.1 (*)    All other components within normal limits  URINALYSIS, ROUTINE W REFLEX MICROSCOPIC - Abnormal; Notable for the following components:   Color, Urine RED (*)    APPearance CLOUDY (*)    Hgb urine dipstick LARGE (*)    Bilirubin Urine SMALL (*)    Ketones, ur >=80 (*)    Protein, ur >=300 (*)    Nitrite POSITIVE (*)    All other components within normal limits  LACTIC ACID, PLASMA - Abnormal; Notable for the following components:   Lactic Acid, Venous 2.2 (*)    All other components within normal limits  URINALYSIS, MICROSCOPIC (REFLEX) - Abnormal; Notable for the following components:   Bacteria, UA RARE (*)    All other components within normal limits  LIPASE, BLOOD  LACTIC ACID, PLASMA    EKG None  Radiology No results found.  Procedures .Critical Care  Performed by: Netta Corrigan, PA-C Authorized by: Netta Corrigan, PA-C   Critical care provider statement:    Critical care time (minutes):  40   Critical care time was exclusive of:  Separately billable procedures and treating other patients   Critical care was necessary to treat or prevent imminent or life-threatening deterioration of the following conditions: Uncontrolled hypertension.   Critical care was time spent personally by me on the  following activities:  Blood draw for specimens, development of treatment plan with patient or surrogate, evaluation of patient's response to treatment, obtaining history from patient or surrogate, re-evaluation of patient's condition, pulse oximetry, ordering and review of radiographic studies, ordering and review of laboratory studies, ordering and performing treatments and interventions, review of old charts and examination of patient   I assumed direction of critical care for this patient from another provider in my specialty: no  Medications Ordered in ED Medications  ondansetron (ZOFRAN) injection 4 mg (4 mg Intravenous Given 02/08/23 1522)  lactated ringers bolus 1,000 mL (1,000 mLs Intravenous New Bag/Given 02/08/23 1624)    ED Course/ Medical Decision Making/ A&P                                 Medical Decision Making Amount and/or Complexity of Data Reviewed Labs: ordered.  Risk Prescription drug management.   Jamaar Howes Maciver 52 y.o. presented today for nausea vomiting after prostate biopsy. Working DDx that I considered at this time includes, but not limited to, uncontrolled hypertension, electrolyte abnormalities UTI, pyelonephritis, prostatitis.  R/o DDx: pyelonephritis, prostatitis, electrolyte abnormalities: These are considered less likely due to history of present illness, physical exam, labs/imaging findings  Review of prior external notes: 02/06/2023 office visit  Unique Tests and My Interpretation:  CBC: Mild leukocytosis 12.7 CMP: Mild increase in bilirubin 2.1 UA: Nitrate positive, proteinuria, ketonuria, large hemoglobin most likely from biopsy earlier today Lipase: Unremarkable Lactic acid: 2.2, 1.7  Discussion with Independent Historian:  Significant other  Discussion of Management of Tests: None  Risk: Medium: prescription drug management  Risk Stratification Score: None  Staffed with Young, DO  Plan: On exam patient was no acute distress  but was noted to be hypertensive 222/107.  Significant other at bedside does state that his blood pressure does run high however this upon chart review does appear much higher than previous.  This could be secondary to patient's reported nausea and vomiting.  Patient is exam is unremarkable however patient stated he was nauseous and so we will give Zofran and obtain labs.  Due to patient's hypertension we will withhold fluids at this time until he is more information.  On recheck patient's blood pressure was 183/99.  Labs did come back for mild increase in the lactic acid 2.2 with a leukocytosis 12.7.  No pyuria.  Sepsis was not activated as there is a very low suspicion of sepsis even given the results.  Patient does have UTI based on the results and we will treat with fluids and monitor.  Anticipate discharge with Keflex and urology follow-up.  On recheck patient stated he is feeling much better with the fluids and Zofran.  Patient states that he does not feel he needs a CT scan at this time and so we will recheck patient after the fluids and if stable will discharge on Keflex with outpatient follow-up.  I spoke to the patient about his blood pressure and given the elevation today and previous visits we agreed to discharge patient on amlodipine 5 mg and follow-up with his primary care provider to figure out the long-term solution for his blood pressure.  I spoke to the patient with the side effects of the amlodipine including leg swelling patient verbalized understanding acceptance of this.  Patient's repeat lactate was 1.7.  Patient at time of discharge had BP of 203/132 but denies any chest pain or shortness of breath or her vision changes or headaches.  Patient is p.o. challenged and able to urinate without difficulty and is safe to be discharged.  Will discharge patient on Keflex, amlodipine, Zofran for symptoms.  Encouraged patient to follow-up with primary care provider in 1 week for blood pressure  check and for a long-term solution for his blood pressure and to follow-up with his urologist to update them of the visit.  Patient is afebrile and at  this time not endorsing any pelvic pain or lower back possible prostatitis  Patient was given return precautions. Patient stable for discharge at this time.  Patient verbalized understanding of plan.         Final Clinical Impression(s) / ED Diagnoses Final diagnoses:  Uncontrolled hypertension  Acute cystitis with hematuria    Rx / DC Orders ED Discharge Orders          Ordered    cephALEXin (KEFLEX) 500 MG capsule  4 times daily        02/08/23 1655    amLODipine (NORVASC) 5 MG tablet  Daily        02/08/23 1804    ondansetron (ZOFRAN) 4 MG tablet  Every 6 hours        02/08/23 1806              Remi Deter 02/08/23 1812    Coral Spikes, DO 02/08/23 2232

## 2023-02-08 NOTE — ED Triage Notes (Signed)
C/O cold sweats & NV. Reports had a prostate biopsy earlier today, reports had some blood in urine which they were told is to be expected. Family at bedside stated they were instructed to go to ER.

## 2023-02-08 NOTE — Discharge Instructions (Addendum)
Thank you for the opportunity to take care of you in our Emergency Department. You have been diagnosed with high blood pressure, also known as hypertension. This means that the force of blood against the walls of your blood vessels called is too strong. It also means that your heart has to work harder to move the blood. High blood pressure usually has no symptoms, but over time, it can cause serious health problems such as Heart attack and heart failure Stroke Kidney disease and failure Vision loss With the help from your healthcare provider and some important life style changes, you can manage your blood pressure and protect your health. Please read the instructions provided on hypertension, how to manage it and how to check your blood pressure. Additionally, use the blood pressure log provided to record your blood pressures. Take the blood pressure log with you to your primary care doctor so that they can adjust your blood pressure medications if needed. Please read the instructions on follow-up appointment. Return to the ER or Call 911 right away if you have any of these symptoms: Chest pain or shortness of breath Severe headache Weakness, tingling, or numbness of your face, arms, or legs (especially on 1 side of the body) Sudden change in vision Confusion, trouble speaking, or trouble understanding speech  Your labs also show that you have a UTI and I will prescribe you antibiotics to take along with Zofran to help with nausea.  Please follow-up with your primary care provider in 1 week to have your blood pressure rechecked and to figure out a long-term solution for your blood pressure.  Please follow-up with your urologist as well so they updated on your recent visit.  If symptoms change or worsen please return to ER.

## 2023-02-13 ENCOUNTER — Ambulatory Visit (INDEPENDENT_AMBULATORY_CARE_PROVIDER_SITE_OTHER): Payer: BC Managed Care – PPO | Admitting: Family Medicine

## 2023-02-13 ENCOUNTER — Encounter: Payer: Self-pay | Admitting: Family Medicine

## 2023-02-13 VITALS — BP 140/92 | HR 73 | Temp 98.3°F | Ht 74.0 in | Wt 211.0 lb

## 2023-02-13 DIAGNOSIS — R030 Elevated blood-pressure reading, without diagnosis of hypertension: Secondary | ICD-10-CM

## 2023-02-13 DIAGNOSIS — R300 Dysuria: Secondary | ICD-10-CM

## 2023-02-13 MED ORDER — CEPHALEXIN 500 MG PO CAPS: 500.0000 mg | ORAL_CAPSULE | Freq: Four times a day (QID) | ORAL | 0 refills | Status: DC

## 2023-02-13 NOTE — Assessment & Plan Note (Signed)
Discussed options.  Clearly better now.  Would finish current rx for keflex.   When done with keflex, if having more urinary symptoms, then collect a sample and then restart keflex.  Drop the sample off at a lab and he can update me about his situation.

## 2023-02-13 NOTE — Patient Instructions (Signed)
Finish the keflex.   If you have more urinary symptoms, then collect a sample and then restart keflex.  Drop the sample off at a lab and update me about your situation.   If your BP gradually improves, then don't start amlodipine.  Goal BP is <140/<90.    If you need to start amlodipine but don't tolerate it then let me know.   Take care.  Glad to see you.

## 2023-02-13 NOTE — Progress Notes (Signed)
ER f/u.  He had prostate biopsy 02/08/23 then had new sx after that, which led to the ER eval.  He had nausea, blood in urine.  He felt like he was freezing and sweating and felt weak.  Treated for UTI with keflex and discharged.  Elevated BP noted, given rx for amlodipine.  He is still on keflex.  Hasn't started amlodipine yet.  No blood in urine now.  No fevers.  No chills.  He feels better.    ER eval and labs d/w pt.  Prostate path results still pending.   Meds, vitals, and allergies reviewed.   ROS: Per HPI unless specifically indicated in ROS section   Nad Ncat Neck supple, no LA Rrr Ctab Abd soft, not ttp Skin well perfused.

## 2023-02-13 NOTE — Assessment & Plan Note (Signed)
Elevated BP likely related to illness.   If BP gradually improves, then wouldn't need to start amlodipine.  Goal BP is <140/<90.    If needing to start amlodipine but can't tolerate it then he can let me know.

## 2023-02-22 DIAGNOSIS — N401 Enlarged prostate with lower urinary tract symptoms: Secondary | ICD-10-CM | POA: Diagnosis not present

## 2023-02-22 DIAGNOSIS — C61 Malignant neoplasm of prostate: Secondary | ICD-10-CM | POA: Diagnosis not present

## 2023-02-22 DIAGNOSIS — R3912 Poor urinary stream: Secondary | ICD-10-CM | POA: Diagnosis not present

## 2023-02-27 ENCOUNTER — Other Ambulatory Visit: Payer: Self-pay | Admitting: Gastroenterology

## 2023-02-28 ENCOUNTER — Encounter: Payer: Self-pay | Admitting: Family Medicine

## 2023-02-28 NOTE — Telephone Encounter (Deleted)
Patient is due for CPE. Has gotten in the past from GI. Ok to fill as pended.

## 2023-03-01 ENCOUNTER — Other Ambulatory Visit: Payer: Self-pay | Admitting: Family Medicine

## 2023-03-01 MED ORDER — ONDANSETRON HCL 4 MG PO TABS: 4.0000 mg | ORAL_TABLET | Freq: Three times a day (TID) | ORAL | 1 refills | Status: DC | PRN

## 2023-03-07 ENCOUNTER — Ambulatory Visit: Payer: BC Managed Care – PPO | Admitting: Family Medicine

## 2023-03-07 ENCOUNTER — Encounter: Payer: Self-pay | Admitting: Family Medicine

## 2023-03-07 VITALS — BP 144/78 | HR 77 | Temp 98.5°F | Ht 74.0 in | Wt 206.4 lb

## 2023-03-07 DIAGNOSIS — C61 Malignant neoplasm of prostate: Secondary | ICD-10-CM

## 2023-03-07 DIAGNOSIS — R110 Nausea: Secondary | ICD-10-CM | POA: Diagnosis not present

## 2023-03-07 MED ORDER — OMEPRAZOLE 40 MG PO CPDR: 40.0000 mg | DELAYED_RELEASE_CAPSULE | Freq: Every day | ORAL | 1 refills | Status: DC

## 2023-03-07 NOTE — Progress Notes (Unsigned)
He had ER eval after prostate biopsy.  No more fevers after ER eval.  No burning with urination.    He is considering prostate cancer tx options.  He is going to get a second opinion.    Discussed recent events from 03/05/23.  Had breakfast early, went to work ~2:30 AM.  Diarrhea/runny stools at ~5AM.  Then had nausea and sweating.  Then started vomiting, had to leave work early.  Vomited mult times.  No blood in stool.    This is the 4th episode.    Meds, vitals, and allergies reviewed.   ROS: Per HPI unless specifically indicated in ROS section   Diarrhea, sweating, vomiting, dec appetite and fatigue with each episode. No clear trigger for each episode.     Ask GI about unifying dx!!!!!

## 2023-03-07 NOTE — Patient Instructions (Addendum)
40mg  prilosec for now and let me check with GI.  We'll be in touch.  Take care.  Glad to see you.

## 2023-03-09 ENCOUNTER — Telehealth: Payer: Self-pay | Admitting: Family Medicine

## 2023-03-09 DIAGNOSIS — C61 Malignant neoplasm of prostate: Secondary | ICD-10-CM | POA: Insufficient documentation

## 2023-03-09 NOTE — Assessment & Plan Note (Signed)
He has had 4 episodes that all had diarrhea, sweating, vomiting, dec appetite and fatigue. No clear trigger for each episode.  He feels fine now.  I think it makes sense to increase his PPI in the meantime.  I do not know if this patient is having cynical vomiting versus abdominal migraine and I am going to ask for GI input.

## 2023-03-09 NOTE — Telephone Encounter (Signed)
This kind patient has had 4 episodes of nausea vomiting and diarrhea without clear trigger.  Is it possible for him to have abdominal migraines versus cyclical vomiting?  I need your input.  I had him increase his PPI in the meantime.  Many thanks.

## 2023-03-09 NOTE — Assessment & Plan Note (Signed)
Per urology.  See above.

## 2023-03-10 DIAGNOSIS — N400 Enlarged prostate without lower urinary tract symptoms: Secondary | ICD-10-CM | POA: Diagnosis not present

## 2023-03-10 DIAGNOSIS — C61 Malignant neoplasm of prostate: Secondary | ICD-10-CM | POA: Diagnosis not present

## 2023-03-10 DIAGNOSIS — Z1331 Encounter for screening for depression: Secondary | ICD-10-CM | POA: Diagnosis not present

## 2023-03-15 NOTE — Telephone Encounter (Signed)
Greatly appreciate input from GI.  Please update patient that GI should be in contact with him.  It is possible that he has a syndrome called cyclical vomiting and I need GI input about management.

## 2023-03-15 NOTE — Telephone Encounter (Signed)
Lynann Bologna, MD  You; Boone Master Blair Heys; Emeline Darling, RN19 hours ago (5:02 PM)   RG Very likely cyclical vomiting syndrome Entire workup has been negative  Will have him follow-up with Bayley RG

## 2023-03-15 NOTE — Telephone Encounter (Signed)
Called patient and reviewed all information. Patient verbalized understanding. Will call if any further questions.  

## 2023-03-16 ENCOUNTER — Telehealth: Payer: Self-pay | Admitting: Family Medicine

## 2023-03-16 DIAGNOSIS — C61 Malignant neoplasm of prostate: Secondary | ICD-10-CM | POA: Diagnosis not present

## 2023-03-16 NOTE — Telephone Encounter (Signed)
Spoke with patient & scheduled OV for 03/22/23 with Bayley, PA at 11:30 am.

## 2023-03-16 NOTE — Telephone Encounter (Signed)
Insurance is saying that patient needs PA for medication omeprazole (PRILOSEC) 40 MG capsule

## 2023-03-17 ENCOUNTER — Ambulatory Visit: Payer: BC Managed Care – PPO | Admitting: Family Medicine

## 2023-03-20 ENCOUNTER — Other Ambulatory Visit (HOSPITAL_COMMUNITY): Payer: Self-pay

## 2023-03-21 ENCOUNTER — Other Ambulatory Visit: Payer: Self-pay | Admitting: Gastroenterology

## 2023-03-22 ENCOUNTER — Other Ambulatory Visit (HOSPITAL_COMMUNITY): Payer: Self-pay

## 2023-03-22 ENCOUNTER — Encounter: Payer: Self-pay | Admitting: Gastroenterology

## 2023-03-22 ENCOUNTER — Ambulatory Visit: Payer: BC Managed Care – PPO | Admitting: Gastroenterology

## 2023-03-22 VITALS — BP 130/80 | Ht 74.0 in | Wt 207.5 lb

## 2023-03-22 DIAGNOSIS — R112 Nausea with vomiting, unspecified: Secondary | ICD-10-CM

## 2023-03-22 NOTE — Patient Instructions (Signed)
You have been scheduled for a HIDA scan at [facility] (1st floor) on 03/28/23 . Please arrive 30 minutes prior to your scheduled appointment at  8:00 am . Make certain not to have anything to eat or drink at least 6 hours prior to your test. Should this appointment date or time not work well for you, please call radiology scheduling at [phone].  _____________________________________________________________________ hepatobiliary (HIDA) scan is an imaging procedure used to diagnose problems in the liver, gallbladder and bile ducts. In the HIDA scan, a radioactive chemical or tracer is injected into a vein in your arm. The tracer is handled by the liver like bile. Bile is a fluid produced and excreted by your liver that helps your digestive system break down fats in the foods you eat. Bile is stored in your gallbladder and the gallbladder releases the bile when you eat a meal. A special nuclear medicine scanner (gamma camera) tracks the flow of the tracer from your liver into your gallbladder and small intestine.  During your HIDA scan  You'll be asked to change into a hospital gown before your HIDA scan begins. Your health care team will position you on a table, usually on your back. The radioactive tracer is then injected into a vein in your arm.The tracer travels through your bloodstream to your liver, where it's taken up by the bile-producing cells. The radioactive tracer travels with the bile from your liver into your gallbladder and through your bile ducts to your small intestine.You may feel some pressure while the radioactive tracer is injected into your vein. As you lie on the table, a special gamma camera is positioned over your abdomen taking pictures of the tracer as it moves through your body. The gamma camera takes pictures continually for about an hour. You'll need to keep still during the HIDA scan. This can become uncomfortable, but you may find that you can lessen the discomfort by  taking deep breaths and thinking about other things. Tell your health care team if you're uncomfortable. The radiologist will watch on a computer the progress of the radioactive tracer through your body. The HIDA scan may be stopped when the radioactive tracer is seen in the gallbladder and enters your small intestine. This typically takes about an hour. In some cases extra imaging will be performed if original images aren't satisfactory, if morphine is given to help visualize the gallbladder or if the medication CCK is given to look at the contraction of the gallbladder. This test typically takes 2 hours to complete.   If your blood pressure at your visit was 140/90 or greater, please contact your primary care physician to follow up on this.  _______________________________________________________  If you are age 40 or older, your body mass index should be between 23-30. Your Body mass index is 26.64 kg/m. If this is out of the aforementioned range listed, please consider follow up with your Primary Care Provider.  If you are age 66 or younger, your body mass index should be between 19-25. Your Body mass index is 26.64 kg/m. If this is out of the aformentioned range listed, please consider follow up with your Primary Care Provider.   ________________________________________________________  The [facility] providers would like to encourage you to use [facility] to communicate with providers for non-urgent requests or questions.  Due to long hold times on the telephone, sending your provider a message by [facility] may be a faster and more efficient way to get a response.  Please allow 48  business hours for a response.  Please remember that this is for non-urgent requests.  _______________________________________________________  Due to recent changes in healthcare laws, you may see the results of your imaging and laboratory studies on MyChart before your provider has had a chance to review them.  We  understand that in some cases there may be results that are confusing or concerning to you. Not all laboratory results come back in the same time frame and the provider may be waiting for multiple results in order to interpret others.  Please give [place] 48 hours in order for your provider to thoroughly review all the results before contacting the office for clarification of your results.   Thank you for choosing me and [facility].  Bayley McMichael PA-C

## 2023-03-22 NOTE — Progress Notes (Signed)
Chief Complaint: nausea and vomiting Primary GI MD: Dr. Chales Abrahams  HPI: Very pleasant 52 year old male with past medical history as listed below presents for evaluation of nausea and vomiting.  Last seen July 2024 in which he had occasional nausea and vomiting and he was set up for EGD and colonoscopy  Recent CT scan was negative for gallstones.    Discussed the use of AI scribe software for clinical note transcription with the patient, who gave verbal consent to proceed.  History of Present Illness   The patient, with a history of gastritis, presents with recurrent episodes of nausea, vomiting, and loose stools. The episodes occur unpredictably, sometimes after eating, and have been occurring since July. During an episode, the patient first experiences loose stools, followed by profuse sweating, and then vomiting. The vomiting can last for up to an hour, after which the patient experiences frequent urination. The patient reports no associated pain, but describes feeling weak and unable to eat or drink until the following day. The patient's symptoms have been less frequent since starting omeprazole 40mg . The patient recently underwent an endoscopy and colonoscopy, which showed mild inflammation in the stomach.      PREVIOUS GI WORKUP   EGD 12/28/2022 - Mild gastroduodenitis.  - Localized gastritis in fundus ( ? Importance - biopsied)  Colonoscopy 12/28/22 - Two 4 to 6 mm polyps in the proximal rectum and in the mid rectum, removed with a cold snare. Resected and retrieved.  - Minimal right colonic diverticulosis.  - Non- bleeding internal hemorrhoids.  - The examination was otherwise normal on direct and retroflexion views.  Diagnosis 1. Surgical [P], gastric antrum and gastric body - ANTRAL MUCOSA WITH REACTIVE GASTRITIS. - UNREMARKABLE OXYNTIC MUCOSA. - NEGATIVE FOR H. PYLORI, INTESTINAL METAPLASIA, DYSPLASIA, AND MALIGNANCY. 2. Surgical [P], fundus gastric - OXYNTIC MUCOSA WITH  FOCAL NON-SPECIFIC SUPERFICIAL STROMAL HEMORRHAGE AND CHANGES CONSISTENT WITH PROTON PUMP INHIBITOR EFFECT. - NEGATIVE FOR H. PYLORI, INTESTINAL METAPLASIA, DYSPLASIA, AND MALIGNANCY. 3. Surgical [P], colon, rectum x 2, polyp (2) - HYPERPLASTIC POLYP (2). - NEGATIVE FOR DYSPLASIA AND MALIGNANCY.  Past Medical History:  Diagnosis Date   Allergy    Blood in stool    Hemorrhoids, external    History of chicken pox     Past Surgical History:  Procedure Laterality Date   COLONOSCOPY      Current Outpatient Medications  Medication Sig Dispense Refill   omeprazole (PRILOSEC) 40 MG capsule Take 1 capsule (40 mg total) by mouth daily. 90 capsule 1   ondansetron (ZOFRAN) 4 MG tablet Take 1 tablet (4 mg total) by mouth 3 (three) times daily as needed for nausea or vomiting. 30 tablet 1   SUMAtriptan (IMITREX) 100 MG tablet Take 1 tablet earliest onset of headache.  May repeat in 2 hours if headache persists or recurs.  Maximum 2 tablets in 24 hours. 10 tablet 5   tamsulosin (FLOMAX) 0.4 MG CAPS capsule Take 1 capsule (0.4 mg total) by mouth daily. 90 capsule 3   No current facility-administered medications for this visit.    Allergies as of 03/22/2023   (No Known Allergies)    Family History  Problem Relation Age of Onset   Cervical cancer Mother    Hypertension Maternal Grandmother    Diabetes Maternal Grandmother    Cancer Maternal Grandmother        unsure type   Prostate cancer Neg Hx    Colon cancer Neg Hx    Rectal cancer Neg Hx  Stomach cancer Neg Hx    Esophageal cancer Neg Hx     Social History   Socioeconomic History   Marital status: Single    Spouse name: Not on file   Number of children: 3   Years of education: Not on file   Highest education level: Not on file  Occupational History   Not on file  Tobacco Use   Smoking status: Never   Smokeless tobacco: Never  Vaping Use   Vaping status: Never Used  Substance and Sexual Activity   Alcohol use: Yes     Alcohol/week: 0.0 standard drinks of alcohol    Comment: occ   Drug use: No   Sexual activity: Not on file  Other Topics Concern   Not on file  Social History Narrative   Education:  High school   [employer] fan    Coached youth league basketball   Prev wireless network install, now doing warehouse work    3 kids   Social Determinants of Health   Financial Resource Strain: Low Risk  (03/09/2023)   Received from [facility]   Overall Financial Resource Strain (CARDIA)    Difficulty of Paying Living Expenses: Not hard at all  Food Insecurity: No Food Insecurity (03/09/2023)   [facility]   Hunger Vital Sign    Worried About Running Out of Food in the Last Year: Never true    Ran Out of Food in the Last Year: Never true  Transportation Needs: No Transportation Needs (03/09/2023)   Received from [facility] - Transportation    Lack of Transportation (Medical): No    Lack of Transportation (Non-Medical): No  Physical Activity: Sufficiently Active (03/09/2023)   Received from [facility]   Exercise Vital Sign    Days of Exercise per Week: 5 days    Minutes of Exercise per Session: 120 min  Stress: No Stress Concern Present (03/09/2023)   Received from [facility] - Occupational Stress Questionnaire    Feeling of Stress : Not at all  Social Connections: Socially Integrated (03/09/2023)   Received from [facility]   Social Network    How would you rate your social network (family, work, friends)?: Good participation with social networks  Intimate Partner Violence: Not At Risk (03/09/2023)   Received from [facility]   HITS    Over the last 12 months how often did your partner physically hurt you?: Never    Over the last 12 months how often did your partner insult you or talk down to you?: Never    Over the last 12 months how often did your partner threaten you with physical harm?: Never    Over the  last 12 months how often did your partner scream or curse at you?: Never    Review of Systems:    Constitutional: No weight loss, fever, chills, weakness or fatigue HEENT: Eyes: No change in vision               Ears, Nose, Throat:  No change in hearing or congestion Skin: No rash or itching Cardiovascular: No chest pain, chest pressure or palpitations   Respiratory: No SOB or cough Gastrointestinal: See HPI and otherwise negative Genitourinary: No dysuria or change in urinary frequency Neurological: No headache, dizziness or syncope Musculoskeletal: No new muscle or joint pain Hematologic: No bleeding or bruising Psychiatric: No history of depression or anxiety    Physical Exam:  Vital signs: BP 130/80   Ht 6\' 2"  (1.88 m)   Wt 94.1 kg   BMI 26.64 kg/m   Constitutional: NAD, Well developed, Well nourished, alert and cooperative Head:  Normocephalic and atraumatic. Eyes:   PEERL, EOMI. No icterus. Conjunctiva pink. Respiratory: Respirations even and unlabored. Lungs clear to auscultation bilaterally.   No wheezes, crackles, or rhonchi.  Cardiovascular:  Regular rate and rhythm. No peripheral edema, cyanosis or pallor.  Gastrointestinal:  Soft, nondistended, nontender. No rebound or guarding. Normal bowel sounds. No appreciable masses or hepatomegaly. Rectal:  Not performed.  Msk:  Symmetrical without gross deformities. Without edema, no deformity or joint abnormality.  Neurologic:  Alert and  oriented x4;  grossly normal neurologically.  Skin:   Dry and intact without significant lesions or rashes. Psychiatric: Oriented to person, place and time. Demonstrates good judgement and reason without abnormal affect or behaviors.  Physical Exam   ABDOMEN: No pain on palpation.       RELEVANT LABS AND IMAGING: CBC    Component Value Date/Time   WBC 12.7 (H) 02/08/2023 1446   RBC 4.97 02/08/2023 1446   HGB 16.7 02/08/2023 1446   HCT 48.3 02/08/2023 1446   PLT 242 02/08/2023  1446   MCV 97.2 02/08/2023 1446   MCH 33.6 02/08/2023 1446   MCHC 34.6 02/08/2023 1446   RDW 13.2 02/08/2023 1446   LYMPHSABS 0.8 02/08/2023 1446   MONOABS 0.6 02/08/2023 1446   EOSABS 0.0 02/08/2023 1446   BASOSABS 0.0 02/08/2023 1446    CMP     Component Value Date/Time   NA 137 02/08/2023 1446   K 3.6 02/08/2023 1446   CL 97 (L) 02/08/2023 1446   CO2 26 02/08/2023 1446   GLUCOSE 110 (H) 02/08/2023 1446   BUN 13 02/08/2023 1446   CREATININE 1.15 02/08/2023 1446   CREATININE 1.21 08/26/2022 1632   CALCIUM 9.4 02/08/2023 1446   PROT 8.8 (H) 02/08/2023 1446   ALBUMIN 4.7 02/08/2023 1446   AST 27 02/08/2023 1446   AST 19 07/06/2017 0000   ALT 27 02/08/2023 1446   ALT 20 07/06/2017 0000   ALKPHOS 75 02/08/2023 1446   BILITOT 2.1 (H) 02/08/2023 1446   GFRNONAA >60 02/08/2023 1446     Assessment/Plan:      Episodic Nausea and Vomiting Episodes of nausea, vomiting, and loose stools occurring sporadically since July 2024. No associated heartburn or dysphagia. Episodes are preceded by loose stools, profuse sweating, and are followed by frequent urination. Episodes last for about a day and leave the patient feeling weak. Last episode was 3 weeks ago. Recent endoscopy showed mild gastritis and patient is currently on Omeprazole 40mg  daily. Episodes have decreased in frequency since starting Omeprazole. -Order HIDA scan to rule out biliary dyskinesia. -If HIDA scan is negative and another episode occurs, consider increasing Omeprazole to 40mg  twice daily. -Patient to message doctor via MyChart at the onset of the next episode.  Gastritis Mild inflammation noted on recent endoscopy. Patient is currently on Omeprazole 40mg  daily. -Continue Omeprazole 40mg  daily. -Consider increasing dose to 40mg  twice daily if vomiting episodes persist after ruling out biliary dyskinesia.      Lara Mulch [facility] 03/22/2023, 1:08 PM  Cc: Joaquim Nam, MD0

## 2023-03-23 ENCOUNTER — Other Ambulatory Visit (HOSPITAL_COMMUNITY): Payer: Self-pay

## 2023-03-23 ENCOUNTER — Telehealth: Payer: Self-pay

## 2023-03-23 DIAGNOSIS — Z191 Hormone sensitive malignancy status: Secondary | ICD-10-CM | POA: Diagnosis not present

## 2023-03-23 DIAGNOSIS — C61 Malignant neoplasm of prostate: Secondary | ICD-10-CM | POA: Diagnosis not present

## 2023-03-23 NOTE — Telephone Encounter (Signed)
PA request has been  Started . New Encounter created for follow up. For additional info see Pharmacy Prior Auth telephone encounter from 03/23/2023.

## 2023-03-23 NOTE — Telephone Encounter (Signed)
Pharmacy Patient Advocate Encounter  Received notification from [facility] that Prior Authorization for Omeprazole has been APPROVED from 03/23/2023 to 03/22/2026. Ran test claim, Copay is $3.08. This test claim was processed through [facility]- copay amounts may vary at other pharmacies due to pharmacy/plan contracts, or as the patient moves through the different stages of their insurance plan.   PA #/Case ID/Reference #: [id number] SS

## 2023-03-24 ENCOUNTER — Other Ambulatory Visit (HOSPITAL_COMMUNITY): Payer: Self-pay

## 2023-03-24 ENCOUNTER — Other Ambulatory Visit: Payer: Self-pay | Admitting: Urology

## 2023-03-28 ENCOUNTER — Encounter (HOSPITAL_COMMUNITY)
Admission: RE | Admit: 2023-03-28 | Discharge: 2023-03-28 | Disposition: A | Discharge status: Home / Self Care | Payer: BC Managed Care – PPO | Source: Ambulatory Visit | Attending: Gastroenterology | Admitting: Gastroenterology

## 2023-03-28 DIAGNOSIS — R112 Nausea with vomiting, unspecified: Secondary | ICD-10-CM | POA: Insufficient documentation

## 2023-03-28 DIAGNOSIS — R109 Unspecified abdominal pain: Secondary | ICD-10-CM | POA: Diagnosis not present

## 2023-03-28 MED ORDER — TECHNETIUM TC 99M MEBROFENIN IV KIT
7.4000 | PACK | Freq: Once | INTRAVENOUS | Status: AC
  Administered 2023-03-28: 7.4 via INTRAVENOUS

## 2023-04-10 ENCOUNTER — Ambulatory Visit: Payer: BC Managed Care – PPO | Admitting: Family Medicine

## 2023-04-10 VITALS — BP 132/70 | HR 89 | Temp 98.5°F | Ht 74.0 in | Wt 211.0 lb

## 2023-04-10 DIAGNOSIS — R110 Nausea: Secondary | ICD-10-CM

## 2023-04-10 NOTE — Patient Instructions (Signed)
If doing well, continue as is.  If more troubles, then increase Omeprazole to 40mg  twice daily.  Let me know if you need another rx.  Take care.  Glad to see you.

## 2023-04-10 NOTE — Progress Notes (Unsigned)
He has urology f/u pending for early 2025 and he is considering options in the meantime about treatment vs surveillance.   D/w pt.   He had GI evaluation in the meantime with hepatobiliary scan showing low normal gallbladder ejection fraction.  Discussed gallbladder ejection pathophysiology.  He did not have pain in the abdomen during the exam.  He has not had any symptoms since he has increased his PPI dose.  Meds, vitals, and allergies reviewed.   ROS: Per HPI unless specifically indicated in ROS section   Nad Ncat Neck supple, no LA Rrr Ctab Abd soft, not ttp Skin well-perfused.

## 2023-04-12 NOTE — Assessment & Plan Note (Signed)
Discussed options.  Overall reassuring hepatobiliary scan.  Discussed plan from GI.  Symptoms improved with PPI use. Would continue PPI for now.  If he has another episode he could increase PPI to twice daily dosing and update me as needed.  Okay for outpatient follow-up.

## 2023-04-21 ENCOUNTER — Ambulatory Visit: Payer: BC Managed Care – PPO | Admitting: Family Medicine

## 2023-05-16 ENCOUNTER — Encounter: Payer: Self-pay | Admitting: Family Medicine

## 2023-05-16 ENCOUNTER — Ambulatory Visit (INDEPENDENT_AMBULATORY_CARE_PROVIDER_SITE_OTHER): Payer: BC Managed Care – PPO | Admitting: Family Medicine

## 2023-05-16 VITALS — BP 138/90 | HR 74 | Temp 97.9°F | Ht 73.23 in | Wt 220.6 lb

## 2023-05-16 DIAGNOSIS — Z0000 Encounter for general adult medical examination without abnormal findings: Secondary | ICD-10-CM

## 2023-05-16 DIAGNOSIS — Z23 Encounter for immunization: Secondary | ICD-10-CM | POA: Diagnosis not present

## 2023-05-16 DIAGNOSIS — R519 Headache, unspecified: Secondary | ICD-10-CM

## 2023-05-16 DIAGNOSIS — C61 Malignant neoplasm of prostate: Secondary | ICD-10-CM

## 2023-05-16 DIAGNOSIS — R030 Elevated blood-pressure reading, without diagnosis of hypertension: Secondary | ICD-10-CM

## 2023-05-16 DIAGNOSIS — R110 Nausea: Secondary | ICD-10-CM

## 2023-05-16 DIAGNOSIS — Z7189 Other specified counseling: Secondary | ICD-10-CM

## 2023-05-16 MED ORDER — TAMSULOSIN HCL 0.4 MG PO CAPS: 0.4000 mg | ORAL_CAPSULE | Freq: Every day | ORAL | 3 refills | Status: AC

## 2023-05-16 MED ORDER — OMEPRAZOLE 40 MG PO CPDR: 40.0000 mg | DELAYED_RELEASE_CAPSULE | Freq: Every day | ORAL | 3 refills | Status: DC

## 2023-05-16 MED ORDER — SUMATRIPTAN SUCCINATE 100 MG PO TABS: ORAL_TABLET | ORAL | 5 refills | Status: AC

## 2023-05-16 MED ORDER — ONDANSETRON HCL 4 MG PO TABS: 4.0000 mg | ORAL_TABLET | Freq: Three times a day (TID) | ORAL | 1 refills | Status: DC | PRN

## 2023-05-16 NOTE — Patient Instructions (Signed)
 Update me as needed.  I would get a flu shot each fall.   Tdap today.  Take care.  Glad to see you. I will await your urology notes.

## 2023-05-16 NOTE — Progress Notes (Signed)
 CPE- See plan.  Routine anticipatory guidance given to patient.  See health maintenance.  The possibility exists that previously documented standard health maintenance information may have been brought forward from a previous encounter into this note.  If needed, that same information has been updated to reflect the current situation based on today's encounter.    Tetanus 2025 PNA not due.  Shingles d/w pt Flu shot encouraged.    Covid vaccine prev done PSA prev elevated, with urology eval prev done.  Colonoscopy 2024 Diet and exercise discussed.  Living will d/w pt.  Pamila Hoit would be designated if patient were incapacitated.  Labs d/w pt.    Prostate cancer with prev urology eval. He has f/u pending, with plan for active surveillance.   Recheck BP at OV, d/w pt.   GI sx d/w pt.  Still on PPI. Some occ nausea but he hasn't had episodes like prev.    Migraine hx d/w pt.  Taking imitrex  prn but not often.  PMH and SH reviewed  Meds, vitals, and allergies reviewed.   ROS: Per HPI.  Unless specifically indicated otherwise in HPI, the patient denies:  General: fever. Eyes: acute vision changes ENT: sore throat Cardiovascular: chest pain Respiratory: SOB GI: vomiting GU: dysuria Musculoskeletal: acute back pain Derm: acute rash Neuro: acute motor dysfunction Psych: worsening mood Endocrine: polydipsia Heme: bleeding Allergy: hayfever  GEN: nad, alert and oriented HEENT: ncat NECK: supple w/o LA CV: rrr. PULM: ctab, no inc wob ABD: soft, +bs EXT: no edema SKIN: well perfused.   The 10-year ASCVD risk score (Arnett DK, et al., 2019) is: 6.6%   Values used to calculate the score:     Age: 53 years     Sex: Male     Is Non-Hispanic African American: Yes     Diabetic: No     Tobacco smoker: No     Systolic Blood Pressure: 138 mmHg     Is BP treated: No     HDL Cholesterol: 50.4 mg/dL     Total Cholesterol: 194 mg/dL  Discussed with patient about diet and  exercise regarding ASCVD score and blood pressure control.  Would not yet need statin start.

## 2023-05-17 NOTE — Assessment & Plan Note (Signed)
 Recheck BP improved.  Continue work on diet and exercise.

## 2023-05-17 NOTE — Assessment & Plan Note (Signed)
Living will d/w pt.  Andrew Mcconnell would be designated if patient were incapacitated.  

## 2023-05-17 NOTE — Assessment & Plan Note (Signed)
 Tetanus 2025 PNA not due.  Shingles d/w pt Flu shot encouraged.    Covid vaccine prev done PSA prev elevated, with urology eval prev done.  Colonoscopy 2024 Diet and exercise discussed.  Living will d/w pt.  Andrew Mcconnell would be designated if patient were incapacitated.  Labs d/w pt.

## 2023-05-17 NOTE — Assessment & Plan Note (Signed)
 Continue as needed use of Imitrex.  Update me as needed.  He agrees to plan.

## 2023-05-17 NOTE — Assessment & Plan Note (Signed)
 Plan for active surveillance through urology.

## 2023-05-17 NOTE — Assessment & Plan Note (Signed)
 Still on PPI. Some occ nausea but he hasn't had episodes like prev.  Overall improved, continue as is.

## 2023-05-23 NOTE — Addendum Note (Signed)
 Addended by: Leonor Liv on: 05/23/2023 08:03 AM   Modules accepted: Orders

## 2023-06-09 ENCOUNTER — Ambulatory Visit (HOSPITAL_COMMUNITY): Admit: 2023-06-09 | Payer: BC Managed Care – PPO | Admitting: Urology

## 2023-06-09 SURGERY — XI ROBOTIC ASSISTED LAPAROSCOPIC RADICAL PROSTATECTOMY LEVEL 2
Anesthesia: General

## 2023-06-12 DIAGNOSIS — C61 Malignant neoplasm of prostate: Secondary | ICD-10-CM | POA: Diagnosis not present

## 2023-06-12 DIAGNOSIS — N400 Enlarged prostate without lower urinary tract symptoms: Secondary | ICD-10-CM | POA: Diagnosis not present

## 2023-06-21 DIAGNOSIS — C61 Malignant neoplasm of prostate: Secondary | ICD-10-CM | POA: Diagnosis not present

## 2023-06-21 DIAGNOSIS — Z1331 Encounter for screening for depression: Secondary | ICD-10-CM | POA: Diagnosis not present

## 2023-06-27 ENCOUNTER — Other Ambulatory Visit: Payer: Self-pay

## 2023-06-27 MED ORDER — OMEPRAZOLE 40 MG PO CPDR
40.0000 mg | DELAYED_RELEASE_CAPSULE | Freq: Every day | ORAL | 3 refills | Status: AC
Start: 2023-06-27 — End: ?

## 2023-06-27 NOTE — Telephone Encounter (Signed)
Copied from CRM [id number]. Topic: Clinical - Prescription Issue >> Jun 27, 2023  1:56 PM Fredrich Romans wrote: Reason for CRM: patient would like to know if the 90 day supply of medications  omeprazole (PRILOSEC) 40 MG capsule and tamsulosin (FLOMAX) 0.4 MG CAPS capsule sent to   [id number], [place] - [address]  Phone: [phone] Fax: [phone]  Due to his prescription coverage only being [facility].

## 2023-06-30 DIAGNOSIS — M25532 Pain in left wrist: Secondary | ICD-10-CM | POA: Diagnosis not present

## 2023-07-03 DIAGNOSIS — N411 Chronic prostatitis: Secondary | ICD-10-CM | POA: Diagnosis not present

## 2023-07-03 DIAGNOSIS — N4231 Prostatic intraepithelial neoplasia: Secondary | ICD-10-CM | POA: Diagnosis not present

## 2023-07-03 DIAGNOSIS — K219 Gastro-esophageal reflux disease without esophagitis: Secondary | ICD-10-CM | POA: Diagnosis not present

## 2023-07-03 DIAGNOSIS — R972 Elevated prostate specific antigen [PSA]: Secondary | ICD-10-CM | POA: Diagnosis not present

## 2023-07-03 DIAGNOSIS — N429 Disorder of prostate, unspecified: Secondary | ICD-10-CM | POA: Diagnosis not present

## 2023-07-03 DIAGNOSIS — N4289 Other specified disorders of prostate: Secondary | ICD-10-CM | POA: Diagnosis not present

## 2023-07-03 DIAGNOSIS — Z8546 Personal history of malignant neoplasm of prostate: Secondary | ICD-10-CM | POA: Diagnosis not present

## 2023-07-03 DIAGNOSIS — Z79899 Other long term (current) drug therapy: Secondary | ICD-10-CM | POA: Diagnosis not present

## 2023-07-03 DIAGNOSIS — G4733 Obstructive sleep apnea (adult) (pediatric): Secondary | ICD-10-CM | POA: Diagnosis not present

## 2023-07-03 DIAGNOSIS — C61 Malignant neoplasm of prostate: Secondary | ICD-10-CM | POA: Diagnosis not present

## 2023-07-04 DIAGNOSIS — M67832 Other specified disorders of synovium, left wrist: Secondary | ICD-10-CM | POA: Diagnosis not present

## 2023-07-10 DIAGNOSIS — R0683 Snoring: Secondary | ICD-10-CM | POA: Diagnosis not present

## 2023-07-10 DIAGNOSIS — G4733 Obstructive sleep apnea (adult) (pediatric): Secondary | ICD-10-CM | POA: Diagnosis not present

## 2023-07-18 ENCOUNTER — Encounter: Payer: Self-pay | Admitting: Podiatry

## 2023-07-18 ENCOUNTER — Ambulatory Visit: Admitting: Podiatry

## 2023-07-18 ENCOUNTER — Ambulatory Visit (INDEPENDENT_AMBULATORY_CARE_PROVIDER_SITE_OTHER)

## 2023-07-18 DIAGNOSIS — M79672 Pain in left foot: Secondary | ICD-10-CM | POA: Diagnosis not present

## 2023-07-18 DIAGNOSIS — M722 Plantar fascial fibromatosis: Secondary | ICD-10-CM

## 2023-07-18 MED ORDER — MELOXICAM 15 MG PO TABS: 15.0000 mg | ORAL_TABLET | Freq: Every day | ORAL | 3 refills | Status: DC

## 2023-07-18 NOTE — Patient Instructions (Signed)

## 2023-07-20 NOTE — Progress Notes (Signed)
  Subjective:  Patient ID: Andrew Mcconnell, male    DOB: Sep 26, 1970,  MRN: [id number]  Chief Complaint  Patient presents with   Foot Pain    Patient states that he has been having heel pain for about a year now, no medication for pain. The pain is at the bottom of patient foot in all in the heel area. It is pretty much a throbbing pain.     53 y.o. male presents with the above complaint. History confirmed with patient.   Objective:  Physical Exam: warm, good capillary refill, no trophic changes or ulcerative lesions, normal DP and PT pulses, and normal sensory exam. Left Foot: point tenderness over the heel pad   Radiographs: Multiple views x-ray of the left foot: no fracture, dislocation, swelling or degenerative changes noted, plantar calcaneal spur, and posterior calcaneal spur Assessment:   1. Plantar fasciitis, left      Plan:  Patient was evaluated and treated and all questions answered.  Discussed the etiology and treatment options for plantar fasciitis including stretching, formal physical therapy, supportive shoegears such as a running shoe or sneaker, pre fabricated orthoses, injection therapy, and oral medications. We also discussed the role of surgical treatment of this for patients who do not improve after exhausting non-surgical treatment options.   -XR reviewed with patient -Educated patient on stretching and icing of the affected limb -Injection delivered to the plantar fascia of the left foot. -Rx for meloxicam. Educated on use, risks and benefits of the medication  After sterile prep with povidone-iodine solution and alcohol, the left heel was injected with 0.5cc 2% xylocaine plain, 0.5cc 0.5% marcaine plain, 20mg  triamcinolone acetonide, and 4mg  dexamethasone was injected along the medial plantar fascia at the insertion on the plantar calcaneus. The patient tolerated the procedure well without complication.  Return in about 6 weeks (around 08/29/2023) for  recheck plantar fasciitis.

## 2023-08-01 ENCOUNTER — Ambulatory Visit (INDEPENDENT_AMBULATORY_CARE_PROVIDER_SITE_OTHER): Payer: BC Managed Care – PPO | Admitting: Dermatology

## 2023-08-01 ENCOUNTER — Encounter: Payer: Self-pay | Admitting: Dermatology

## 2023-08-01 VITALS — BP 176/115

## 2023-08-01 DIAGNOSIS — B079 Viral wart, unspecified: Secondary | ICD-10-CM | POA: Diagnosis not present

## 2023-08-01 DIAGNOSIS — B078 Other viral warts: Secondary | ICD-10-CM

## 2023-08-01 MED ORDER — SAFETY SEAL MISCELLANEOUS MISC: 2 refills | Status: AC

## 2023-08-01 NOTE — Progress Notes (Signed)
   New Patient Visit   Subjective  Andrew Mcconnell is a 53 y.o. male who presents for the following: New Pt - Warts  Patient states he  has warts located at the pointer finger on L hand that he  would like to have examined. Patient reports the areas have been there for 8 months. He reports the areas are not bothersome.Patient rates irritation 0 out of 10. He states that the areas have not spread. Patient reports he  has previously been treated for these areas by PCP with unsuccessful cryo. Patient denied Hx of bx. Patient denied family history of skin cancer(s).  The patient has spots, moles and lesions to be evaluated, some may be new or changing and the patient may have concern these could be cancer.   The following portions of the chart were reviewed this encounter and updated as appropriate: medications, allergies, medical history  Review of Systems:  No other skin or systemic complaints except as noted in HPI or Assessment and Plan.  Objective  Well appearing patient in no apparent distress; mood and affect are within normal limits.   A focused examination was performed of the following areas: finger   Relevant exam findings are noted in the Assessment and Plan.         Assessment & Plan   WART Exam: verrucous papule(s)  Counseling Discussed viral / HPV (Human Papilloma Virus) etiology and risk of spread /infectivity to other areas of body as well as to other people.  Multiple treatments and methods may be required to clear warts and it is possible treatment may not be successful.  Treatment risks include discoloration; scarring and there is still potential for wart recurrence.  Treatment Plan: - Recommended taking OTC Cimetidine daily to help clear virus. Pic included in AVS.  - Rx Medrock Wart pen - apply to affected finger every night at bedtime.     No follow-ups on file.    Documentation: I have reviewed the above documentation for accuracy and completeness,  and I agree with the above.  I, Shirron Marcha Solders, CMA, am acting as scribe for [employer], DO.   Langston Reusing, DO

## 2023-08-01 NOTE — Patient Instructions (Addendum)
 Hello Mr. Jaworski,  Thank you for visiting today. Here is a summary of the key instructions:  Diagnosis: Wart  - Procedure:   - A shave removal of the wart was performed today   - The base of the wart was frozen  - Wound Care:   - Keep the area clean with soap and water daily   - Apply Vaseline ointment and a band-aid every day   - Keep the bandage on and use your hand carefully for the rest of today  - Medications:   - Start taking cimetidine (Tagamet) daily   - You can buy this at [facility]  - Topical Treatment:   - Wait two weeks before starting the prescribed topical treatment   - Apply the wart medicine every night   - If skin gets too irritated, use it every other night  - Follow-up:   - Return for a follow-up appointment in 3 months  Please reach out if you have any questions or concerns.  Warm regards,  Dr. [facility]       Patient Handout: Wound Care for Skin Biopsy Site  Taking Care of Your Skin Biopsy Site  Proper care of the biopsy site is essential for promoting healing and minimizing scarring. This handout provides instructions on how to care for your biopsy site to ensure optimal recovery.  1. Cleaning the Wound:  Clean the biopsy site daily with gentle soap and water. Gently pat the area dry with a clean, soft towel. Avoid harsh scrubbing or rubbing the area, as this can irritate the skin and delay healing.  2. Applying Aquaphor and Bandage:  After cleaning the wound, apply a thin layer of Aquaphor ointment to the biopsy site. Cover the area with a sterile bandage to protect it from dirt, bacteria, and friction. Change the bandage daily or as needed if it becomes soiled or wet.  3. Continued Care for One Week:  Repeat the cleaning, Aquaphor application, and bandaging process daily for one week following the biopsy procedure. Keeping the wound clean and moist during this initial healing period will help prevent infection and  promote optimal healing.  4. Massaging Aquaphor into the Area:  ---After one week, discontinue the use of bandages but continue to apply Aquaphor to the biopsy site. ----Gently massage the Aquaphor into the area using circular motions. ---Massaging the skin helps to promote circulation and prevent the formation of scar tissue.   Additional Tips:  Avoid exposing the biopsy site to direct sunlight during the healing process, as this can cause hyperpigmentation or worsen scarring. If you experience any signs of infection, such as increased redness, swelling, warmth, or drainage from the wound, contact your healthcare provider immediately. Follow any additional instructions provided by your healthcare provider for caring for the biopsy site and managing any discomfort. Conclusion:  Taking proper care of your skin biopsy site is crucial for ensuring optimal healing and minimizing scarring. By following these instructions for cleaning, applying Aquaphor, and massaging the area, you can promote a smooth and successful recovery. If you have any questions or concerns about caring for your biopsy site, don't hesitate to contact your healthcare provider for guidance.     Important Information  Due to recent changes in healthcare laws, you may see results of your pathology and/or laboratory studies on MyChart before the doctors have had a chance to review them. We understand that in some cases there may be results that are confusing or concerning to you. Please  understand that not all results are received at the same time and often the doctors may need to interpret multiple results in order to provide you with the best plan of care or course of treatment. Therefore, we ask that you please give [place] 2 business days to thoroughly review all your results before contacting the office for clarification. Should we see a critical lab result, you will be contacted sooner.   If You Need Anything After Your  Visit  If you have any questions or concerns for your doctor, please call our main line at [phone] If no one answers, please leave a voicemail as directed and we will return your call as soon as possible. Messages left after 4 pm will be answered the following business day.   You may also send [place] a message via MyChart. We typically respond to MyChart messages within 1-2 business days.  For prescription refills, please ask your pharmacy to contact our office. Our fax number is [phone].  If you have an urgent issue when the clinic is closed that cannot wait until the next business day, you can page your doctor at the number below.    Please note that while we do our best to be available for urgent issues outside of office hours, we are not available 24/7.   If you have an urgent issue and are unable to reach [place], you may choose to seek medical care at your doctor's office, retail clinic, urgent care center, or emergency room.  If you have a medical emergency, please immediately call 911 or go to the emergency department. In the event of inclement weather, please call our main line at [phone] for an update on the status of any delays or closures.  Dermatology Medication Tips: Please keep the boxes that topical medications come in in order to help keep track of the instructions about where and how to use these. Pharmacies typically print the medication instructions only on the boxes and not directly on the medication tubes.   If your medication is too expensive, please contact our office at [phone] or send [place] a message through MyChart.   We are unable to tell what your co-pay for medications will be in advance as this is different depending on your insurance coverage. However, we may be able to find a substitute medication at lower cost or fill out paperwork to get insurance to cover a needed medication.   If a prior authorization is required to get your medication covered by  your insurance company, please allow [place] 1-2 business days to complete this process.  Drug prices often vary depending on where the prescription is filled and some pharmacies may offer cheaper prices.  The website www.goodrx.com contains coupons for medications through different pharmacies. The prices here do not account for what the cost may be with help from insurance (it may be cheaper with your insurance), but the website can give you the price if you did not use any insurance.  - You can print the associated coupon and take it with your prescription to the pharmacy.  - You may also stop by our office during regular business hours and pick up a GoodRx coupon card.  - If you need your prescription sent electronically to a different pharmacy, notify our office through [facility] or by phone at [phone]

## 2023-08-02 ENCOUNTER — Encounter: Payer: Self-pay | Admitting: Dermatology

## 2023-08-02 LAB — SURGICAL PATHOLOGY

## 2023-08-02 NOTE — Progress Notes (Signed)
 Hi Andrew Mcconnell  Dr. Onalee Hua reviewed your biopsy results and they showed the spot removed was benign (not cancerous).  No additional treatment is required.  The detailed report is available to view in MyChart.  Have a great day!  Kind Regards,  Dr. Kermit Balo Care Team

## 2023-08-31 ENCOUNTER — Telehealth: Payer: Self-pay | Admitting: Gastroenterology

## 2023-08-31 NOTE — Telephone Encounter (Signed)
 Andrew Mcconnell, patient's significant other indicates that patient has had occasional episodes of nausea/vomiting and weakness and was told previously that he may need his gallbladder removed. She states that over the last few days however, he has had more frequent attacks of nausea/vomiting and is unable to keep anything down. Gets chills. He then gets weak for a day or so then improves. [name] is advised that we have sent a referral to [facility] to discuss cholecystectomy. She is advised that if patient has severe abdominal pain, intractable nausea/vomiting and inability for PO intake along with any fever, he should be seen in the emergency room. She verbalizes understanding.

## 2023-08-31 NOTE — Telephone Encounter (Signed)
 Inbound call from patients significant other stating that patient has had multiple gallbladder attacks over the last few days and is requesting a call to discuss and see what next steps are. Please advise.

## 2023-08-31 NOTE — Telephone Encounter (Signed)
 Bayley-  Please see HIDA from 03/2023. It looks like we offered to send patient to [facility] to discuss cholecystectomy at that time but he declined as he first wanted to discuss with his fiancee. Would it be appropriate to go ahead and refer to [facility] for recurrent symptoms or would he need a return office visit since we have not seen him since 03/2023?

## 2023-09-06 ENCOUNTER — Telehealth: Payer: Self-pay | Admitting: Gastroenterology

## 2023-09-06 NOTE — Telephone Encounter (Signed)
 Inbound call from [facility] calling to inform Andrew Mcconnell that patient has been scheduled for a office consult with Dr. Jamse Mcgee on 5/7 at 3:40.

## 2023-09-06 NOTE — Telephone Encounter (Signed)
 Noted.

## 2023-09-13 DIAGNOSIS — R112 Nausea with vomiting, unspecified: Secondary | ICD-10-CM | POA: Diagnosis not present

## 2023-09-14 ENCOUNTER — Other Ambulatory Visit: Payer: Self-pay | Admitting: Surgery

## 2023-09-14 DIAGNOSIS — R112 Nausea with vomiting, unspecified: Secondary | ICD-10-CM

## 2023-09-18 ENCOUNTER — Ambulatory Visit
Admission: RE | Admit: 2023-09-18 | Discharge: 2023-09-18 | Disposition: A | Discharge status: Home / Self Care | Source: Ambulatory Visit | Attending: Surgery | Admitting: Surgery

## 2023-09-18 DIAGNOSIS — R112 Nausea with vomiting, unspecified: Secondary | ICD-10-CM

## 2023-09-18 DIAGNOSIS — K828 Other specified diseases of gallbladder: Secondary | ICD-10-CM | POA: Diagnosis not present

## 2023-09-21 DIAGNOSIS — G4733 Obstructive sleep apnea (adult) (pediatric): Secondary | ICD-10-CM | POA: Diagnosis not present

## 2023-09-21 DIAGNOSIS — R0683 Snoring: Secondary | ICD-10-CM | POA: Diagnosis not present

## 2023-09-26 ENCOUNTER — Ambulatory Visit: Admitting: Family Medicine

## 2023-10-10 ENCOUNTER — Ambulatory Visit: Admitting: Family Medicine

## 2023-10-17 ENCOUNTER — Ambulatory Visit: Admitting: Family Medicine

## 2023-10-19 DIAGNOSIS — G4733 Obstructive sleep apnea (adult) (pediatric): Secondary | ICD-10-CM | POA: Diagnosis not present

## 2023-10-19 DIAGNOSIS — I10 Essential (primary) hypertension: Secondary | ICD-10-CM | POA: Diagnosis not present

## 2023-11-01 ENCOUNTER — Ambulatory Visit: Admitting: Dermatology

## 2023-11-03 DIAGNOSIS — G4733 Obstructive sleep apnea (adult) (pediatric): Secondary | ICD-10-CM | POA: Diagnosis not present

## 2023-11-15 ENCOUNTER — Ambulatory Visit: Admitting: Podiatry

## 2023-11-15 VITALS — Ht 73.0 in | Wt 220.6 lb

## 2023-11-15 DIAGNOSIS — M722 Plantar fascial fibromatosis: Secondary | ICD-10-CM

## 2023-11-15 NOTE — Patient Instructions (Signed)

## 2023-11-16 NOTE — Progress Notes (Signed)
  Subjective:  Patient ID: Andrew Mcconnell, male    DOB: 1970/07/08,  MRN: [id number]  Chief Complaint  Patient presents with   Foot Pain    RM 7 Patient is here for plantar fasciitis pain of the left foot. Patients states a dull achy pain after prolonged activity. Patient has been using a foot massager for pain relief.    53 y.o. male presents with the above complaint. History confirmed with patient.  The injection helped quite a bit after the last 1 is just now starting come back but not as bad as it used to be  Objective:  Physical Exam: warm, good capillary refill, no trophic changes or ulcerative lesions, normal DP and PT pulses, and normal sensory exam. Left Foot: point tenderness over the heel pad   Radiographs: Multiple views x-ray of the left foot: no fracture, dislocation, swelling or degenerative changes noted, plantar calcaneal spur, and posterior calcaneal spur Assessment:   1. Plantar fasciitis, left      Plan:  Patient was evaluated and treated and all questions answered.    -Has had improvement with previous injection.  Recommend repeat injection today.  This is noted below.  We also discussed long-term support with custom molded foot orthosis and he will be scheduled for fitting for these. -Educated patient on stretching and icing of the affected limb -Continue meloxicam  as needed  After sterile prep with povidone-iodine solution and alcohol, the left heel was injected with 0.5cc 2% xylocaine plain, 0.5cc 0.5% marcaine  plain, 20mg  triamcinolone  acetonide, and 4mg  dexamethasone  was injected along the medial plantar fascia at the insertion on the plantar calcaneus. The patient tolerated the procedure well without complication.  No follow-ups on file.

## 2023-12-01 ENCOUNTER — Encounter: Payer: Self-pay | Admitting: Family Medicine

## 2023-12-01 ENCOUNTER — Other Ambulatory Visit

## 2023-12-03 DIAGNOSIS — G4733 Obstructive sleep apnea (adult) (pediatric): Secondary | ICD-10-CM | POA: Diagnosis not present

## 2023-12-08 ENCOUNTER — Telehealth: Payer: Self-pay | Admitting: Family Medicine

## 2023-12-08 NOTE — Telephone Encounter (Signed)
 Placed form in basket on Erin's desk.

## 2023-12-08 NOTE — Telephone Encounter (Signed)
 Type of form received:  Medical Leave Paperwork  Additional comments:   NA  Received by:  Inocente  Form should be Faxed to: NA  Form should be mailed to:  NA  Is patient requesting call for pickup: YES, Please call GF Tamika at [phone]   Form placed:  Provider folder  Attach charge sheet. Yes  Individual made aware of 3-5 business day turn around (Y/N)? Yes

## 2023-12-12 NOTE — Telephone Encounter (Signed)
 Received FMLA forms for extension.  Spoke with patient regarding dates, as requested per patient, I reached out to his girlfriend for the dates needed. She asks that I call when the forms are complete so she can pick them up and pass on to the patient's employer. I will also include a copy of the forms for the patient's records.  Forms placed in providers box for review.

## 2023-12-13 NOTE — Telephone Encounter (Signed)
 I will work on the hard copy when possible.  Thanks.

## 2023-12-18 NOTE — Telephone Encounter (Signed)
 Reason for CRM: Patient spouse Pamila called about FMLA paperwork claim# [id number] dropped off two weeks ago- need the paperwork faxed to: [phone] Attn: [place] and Claim ID number: [id number]. and she will come by to pick up the paperwork after documents have been faxed.

## 2023-12-18 NOTE — Telephone Encounter (Signed)
 Completed forms received and faxed to [phone]  New claim number added to forms: [id number]  Patients girlfriend will pick up at the front desk this week or next.

## 2023-12-25 ENCOUNTER — Ambulatory Visit

## 2023-12-26 ENCOUNTER — Encounter: Payer: Self-pay | Admitting: Podiatry

## 2024-01-03 ENCOUNTER — Telehealth: Payer: Self-pay | Admitting: Lab

## 2024-01-03 DIAGNOSIS — G4733 Obstructive sleep apnea (adult) (pediatric): Secondary | ICD-10-CM | POA: Diagnosis not present

## 2024-01-03 NOTE — Telephone Encounter (Signed)
 Patient's wife calling in regards to his pain states sent a my chart message and no response would like a response please advise.

## 2024-01-04 ENCOUNTER — Ambulatory Visit (INDEPENDENT_AMBULATORY_CARE_PROVIDER_SITE_OTHER): Admitting: Podiatry

## 2024-01-04 VITALS — Ht 73.0 in | Wt 220.6 lb

## 2024-01-04 DIAGNOSIS — M2142 Flat foot [pes planus] (acquired), left foot: Secondary | ICD-10-CM | POA: Diagnosis not present

## 2024-01-04 DIAGNOSIS — M722 Plantar fascial fibromatosis: Secondary | ICD-10-CM | POA: Diagnosis not present

## 2024-01-04 DIAGNOSIS — M2141 Flat foot [pes planus] (acquired), right foot: Secondary | ICD-10-CM | POA: Diagnosis not present

## 2024-01-04 MED ORDER — NAPROXEN 500 MG PO TABS: 500.0000 mg | ORAL_TABLET | Freq: Two times a day (BID) | ORAL | 0 refills | Status: AC

## 2024-01-04 MED ORDER — METHYLPREDNISOLONE 4 MG PO TBPK: ORAL_TABLET | ORAL | 0 refills | Status: AC

## 2024-01-04 NOTE — Patient Instructions (Signed)
Call to schedule physical therapy: [facility] [address]  [phone]  Plantar Fasciitis (Heel Spur Syndrome) with Rehab The plantar fascia is a fibrous, ligament-like, soft-tissue structure that spans the bottom of the foot. Plantar fasciitis is a condition that causes pain in the foot due to inflammation of the tissue. SYMPTOMS  Pain and tenderness on the underneath side of the foot. Pain that worsens with standing or walking. CAUSES  Plantar fasciitis is caused by irritation and injury to the plantar fascia on the underneath side of the foot. Common mechanisms of injury include: Direct trauma to bottom of the foot. Damage to a small nerve that runs under the foot where the main fascia attaches to the heel bone. Stress placed on the plantar fascia due to bone spurs. RISK INCREASES WITH:  Activities that place stress on the plantar fascia (running, jumping, pivoting, or cutting). Poor strength and flexibility. Improperly fitted shoes. Tight calf muscles. Flat feet. Failure to warm-up properly before activity. Obesity. PREVENTION Warm up and stretch properly before activity. Allow for adequate recovery between workouts. Maintain physical fitness: Strength, flexibility, and endurance. Cardiovascular fitness. Maintain a health body weight. Avoid stress on the plantar fascia. Wear properly fitted shoes, including arch supports for individuals who have flat feet.  PROGNOSIS  If treated properly, then the symptoms of plantar fasciitis usually resolve without surgery. However, occasionally surgery is necessary.  RELATED COMPLICATIONS  Recurrent symptoms that may result in a chronic condition. Problems of the lower back that are caused by compensating for the injury, such as limping. Pain or weakness of the foot during push-off following surgery. Chronic inflammation, scarring, and partial or complete fascia  tear, occurring more often from repeated injections.  TREATMENT  Treatment initially involves the use of ice and medication to help reduce pain and inflammation. The use of strengthening and stretching exercises may help reduce pain with activity, especially stretches of the Achilles tendon. These exercises may be performed at home or with a therapist. Your caregiver may recommend that you use heel cups of arch supports to help reduce stress on the plantar fascia. Occasionally, corticosteroid injections are given to reduce inflammation. If symptoms persist for greater than 6 months despite non-surgical (conservative), then surgery may be recommended.   MEDICATION  If pain medication is necessary, then nonsteroidal anti-inflammatory medications, such as aspirin and ibuprofen, or other minor pain relievers, such as acetaminophen, are often recommended. Do not take pain medication within 7 days before surgery. Prescription pain relievers may be given if deemed necessary by your caregiver. Use only as directed and only as much as you need. Corticosteroid injections may be given by your caregiver. These injections should be reserved for the most serious cases, because they may only be given a certain number of times.  HEAT AND COLD Cold treatment (icing) relieves pain and reduces inflammation. Cold treatment should be applied for 10 to 15 minutes every 2 to 3 hours for inflammation and pain and immediately after any activity that aggravates your symptoms. Use ice packs or massage the area with a piece of ice (ice massage). Heat treatment may be used prior to performing the stretching and strengthening activities prescribed by your caregiver, physical therapist, or athletic trainer. Use a heat pack or soak the injury in warm water.  SEEK IMMEDIATE MEDICAL CARE IF: Treatment seems to offer no benefit, or the condition worsens. Any medications produce adverse side effects.  EXERCISES- RANGE OF MOTION  (  ROM) AND STRETCHING EXERCISES - Plantar Fasciitis (Heel Spur Syndrome) These exercises may help you when beginning to rehabilitate your injury. Your symptoms may resolve with or without further involvement from your physician, physical therapist or athletic trainer. While completing these exercises, remember:  Restoring tissue flexibility helps normal motion to return to the joints. This allows healthier, less painful movement and activity. An effective stretch should be held for at least 30 seconds. A stretch should never be painful. You should only feel a gentle lengthening or release in the stretched tissue.  RANGE OF MOTION - Toe Extension, Flexion Sit with your right / left leg crossed over your opposite knee. Grasp your toes and gently pull them back toward the top of your foot. You should feel a stretch on the bottom of your toes and/or foot. Hold this stretch for 10 seconds. Now, gently pull your toes toward the bottom of your foot. You should feel a stretch on the top of your toes and or foot. Hold this stretch for 10 seconds. Repeat  times. Complete this stretch 3 times per day.   RANGE OF MOTION - Ankle Dorsiflexion, Active Assisted Remove shoes and sit on a chair that is preferably not on a carpeted surface. Place right / left foot under knee. Extend your opposite leg for support. Keeping your heel down, slide your right / left foot back toward the chair until you feel a stretch at your ankle or calf. If you do not feel a stretch, slide your bottom forward to the edge of the chair, while still keeping your heel down. Hold this stretch for 10 seconds. Repeat 3 times. Complete this stretch 2 times per day.   STRETCH  Gastroc, Standing Place hands on wall. Extend right / left leg, keeping the front knee somewhat bent. Slightly point your toes inward on your back foot. Keeping your right / left heel on the floor and your knee straight, shift your weight toward the wall, not allowing  your back to arch. You should feel a gentle stretch in the right / left calf. Hold this position for 10 seconds. Repeat 3 times. Complete this stretch 2 times per day.  STRETCH  Soleus, Standing Place hands on wall. Extend right / left leg, keeping the other knee somewhat bent. Slightly point your toes inward on your back foot. Keep your right / left heel on the floor, bend your back knee, and slightly shift your weight over the back leg so that you feel a gentle stretch deep in your back calf. Hold this position for 10 seconds. Repeat 3 times. Complete this stretch 2 times per day.  STRETCH  Gastrocsoleus, Standing  Note: This exercise can place a lot of stress on your foot and ankle. Please complete this exercise only if specifically instructed by your caregiver.  Place the ball of your right / left foot on a step, keeping your other foot firmly on the same step. Hold on to the wall or a rail for balance. Slowly lift your other foot, allowing your body weight to press your heel down over the edge of the step. You should feel a stretch in your right / left calf. Hold this position for 10 seconds. Repeat this exercise with a slight bend in your right / left knee. Repeat 3 times. Complete this stretch 2 times per day.   STRENGTHENING EXERCISES - Plantar Fasciitis (Heel Spur Syndrome)  These exercises may help you when beginning to rehabilitate your injury. They may resolve your   symptoms with or without further involvement from your physician, physical therapist or athletic trainer. While completing these exercises, remember:  Muscles can gain both the endurance and the strength needed for everyday activities through controlled exercises. Complete these exercises as instructed by your physician, physical therapist or athletic trainer. Progress the resistance and repetitions only as guided.  STRENGTH - Towel Curls Sit in a chair positioned on a non-carpeted surface. Place your foot on a  towel, keeping your heel on the floor. Pull the towel toward your heel by only curling your toes. Keep your heel on the floor. Repeat 3 times. Complete this exercise 2 times per day.  STRENGTH - Ankle Inversion Secure one end of a rubber exercise band/tubing to a fixed object (table, pole). Loop the other end around your foot just before your toes. Place your fists between your knees. This will focus your strengthening at your ankle. Slowly, pull your big toe up and in, making sure the band/tubing is positioned to resist the entire motion. Hold this position for 10 seconds. Have your muscles resist the band/tubing as it slowly pulls your foot back to the starting position. Repeat 3 times. Complete this exercises 2 times per day.  Document Released: 04/25/2005 Document Revised: 07/18/2011 Document Reviewed: 08/07/2008 [employer] Patient Information 2014 [employer].  

## 2024-01-04 NOTE — Progress Notes (Signed)
  Subjective:  Patient ID: Andrew Mcconnell, male    DOB: [birth year],  MRN: [id number]  Chief Complaint  Patient presents with   Injections    RM 10 Patient is here for injection in left foot for plantar fascitis.    53 y.o. male presents with the above complaint. History confirmed with patient.  Injection helped for about a week after the last 1  Objective:  Physical Exam: warm, good capillary refill, no trophic changes or ulcerative lesions, normal DP and PT pulses, and normal sensory exam. Left Foot: point tenderness over the heel pad   Radiographs: Multiple views x-ray of the left foot: no fracture, dislocation, swelling or degenerative changes noted, plantar calcaneal spur, and posterior calcaneal spur Assessment:   1. Plantar fasciitis, left      Plan:  Patient was evaluated and treated and all questions answered.   Having some pain.  His orthotics were ready and were dispensed today.  They were assessed for form fit and function and I expect these will help quite a bit.  We discussed the break-in process and he will begin wearing these gradually and notify me if there are any issues.  I recommended formal physical therapy and referral was placed for this as well.  Additionally discussed medication changes and I placed him on a methylprednisolone  taper as well as twice daily Naprosyn  500 mg.  Return in 1 month to reevaluate, if no improvement plan for CAM boot immobilization and MRI.    Return in about 1 month (around 02/06/2024) for recheck plantar fasciitis.

## 2024-01-05 NOTE — Progress Notes (Signed)
 Orthotics   Patient was present and evaluated for Custom molded foot orthotics. Patient will benefit from CFO's to provide total contact to BIL MLA's helping to balance and distribute body weight more evenly across BIL feet helping to reduce plantar pressure and pain. Orthotic will also encourage FF / RF alignment  Patient was scanned today and will return for fitting upon receipt

## 2024-01-05 NOTE — Progress Notes (Signed)
Charges added

## 2024-01-22 ENCOUNTER — Ambulatory Visit: Admitting: Physical Therapy

## 2024-01-22 DIAGNOSIS — N400 Enlarged prostate without lower urinary tract symptoms: Secondary | ICD-10-CM | POA: Diagnosis not present

## 2024-01-22 DIAGNOSIS — I10 Essential (primary) hypertension: Secondary | ICD-10-CM | POA: Diagnosis not present

## 2024-02-03 DIAGNOSIS — G4733 Obstructive sleep apnea (adult) (pediatric): Secondary | ICD-10-CM | POA: Diagnosis not present

## 2024-02-20 ENCOUNTER — Telehealth: Payer: Self-pay | Admitting: Podiatry

## 2024-02-20 NOTE — Telephone Encounter (Signed)
 Patient states he has new insurance and he will call in before appointment to update.Will bring new insurance card to next visit.

## 2024-03-05 ENCOUNTER — Ambulatory Visit: Admitting: Podiatry

## 2024-03-14 ENCOUNTER — Ambulatory Visit: Payer: Self-pay | Admitting: Podiatry

## 2024-03-29 DIAGNOSIS — M7712 Lateral epicondylitis, left elbow: Secondary | ICD-10-CM | POA: Diagnosis not present

## 2024-03-29 DIAGNOSIS — M25522 Pain in left elbow: Secondary | ICD-10-CM | POA: Diagnosis not present

## 2024-04-16 ENCOUNTER — Ambulatory Visit: Payer: Self-pay | Admitting: Podiatry

## 2024-04-24 DIAGNOSIS — I10 Essential (primary) hypertension: Secondary | ICD-10-CM | POA: Diagnosis not present

## 2024-04-24 DIAGNOSIS — N400 Enlarged prostate without lower urinary tract symptoms: Secondary | ICD-10-CM | POA: Diagnosis not present

## 2024-05-21 ENCOUNTER — Ambulatory Visit: Payer: Self-pay | Admitting: Podiatry
